# Patient Record
Sex: Female | Born: 1970 | Hispanic: Yes | Smoking: Never smoker
Health system: Southern US, Community
[De-identification: ages and names within clinical notes are randomized; demographics above are authoritative.]

## PROBLEM LIST (undated history)

## (undated) DIAGNOSIS — G43909 Migraine, unspecified, not intractable, without status migrainosus: Secondary | ICD-10-CM

## (undated) DIAGNOSIS — T8859XA Other complications of anesthesia, initial encounter: Secondary | ICD-10-CM

## (undated) DIAGNOSIS — T884XXA Failed or difficult intubation, initial encounter: Secondary | ICD-10-CM

## (undated) DIAGNOSIS — R51 Headache: Secondary | ICD-10-CM

## (undated) DIAGNOSIS — D649 Anemia, unspecified: Secondary | ICD-10-CM

## (undated) DIAGNOSIS — T4145XA Adverse effect of unspecified anesthetic, initial encounter: Secondary | ICD-10-CM

## (undated) DIAGNOSIS — K219 Gastro-esophageal reflux disease without esophagitis: Secondary | ICD-10-CM

## (undated) DIAGNOSIS — R519 Headache, unspecified: Secondary | ICD-10-CM

## (undated) HISTORY — DX: Gastro-esophageal reflux disease without esophagitis: K21.9

## (undated) HISTORY — PX: TUBAL LIGATION: SHX77

## (undated) HISTORY — DX: Migraine, unspecified, not intractable, without status migrainosus: G43.909

---

## 2011-12-13 ENCOUNTER — Ambulatory Visit: Payer: Self-pay | Admitting: Family Medicine

## 2011-12-19 ENCOUNTER — Ambulatory Visit: Payer: Self-pay | Admitting: Family Medicine

## 2012-06-25 ENCOUNTER — Ambulatory Visit: Payer: Self-pay | Admitting: Family Medicine

## 2013-02-05 ENCOUNTER — Ambulatory Visit: Payer: Self-pay | Admitting: Family Medicine

## 2014-02-09 ENCOUNTER — Ambulatory Visit: Payer: Self-pay | Admitting: Family Medicine

## 2015-03-22 DIAGNOSIS — R519 Headache, unspecified: Secondary | ICD-10-CM | POA: Insufficient documentation

## 2015-08-04 ENCOUNTER — Other Ambulatory Visit: Payer: Self-pay

## 2015-08-04 ENCOUNTER — Other Ambulatory Visit: Payer: Self-pay | Admitting: Obstetrics and Gynecology

## 2015-08-04 DIAGNOSIS — Z1231 Encounter for screening mammogram for malignant neoplasm of breast: Secondary | ICD-10-CM

## 2015-08-11 ENCOUNTER — Ambulatory Visit
Admission: RE | Admit: 2015-08-11 | Discharge: 2015-08-11 | Disposition: A | Discharge status: Home / Self Care | Payer: BLUE CROSS/BLUE SHIELD | Source: Ambulatory Visit | Attending: Obstetrics and Gynecology | Admitting: Obstetrics and Gynecology

## 2015-08-11 ENCOUNTER — Other Ambulatory Visit: Payer: Self-pay | Admitting: Neurology

## 2015-08-11 DIAGNOSIS — Z1231 Encounter for screening mammogram for malignant neoplasm of breast: Secondary | ICD-10-CM | POA: Diagnosis not present

## 2015-08-11 DIAGNOSIS — G43711 Chronic migraine without aura, intractable, with status migrainosus: Secondary | ICD-10-CM
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
## 2015-08-16 ENCOUNTER — Encounter
Admission: RE | Admit: 2015-08-16 | Discharge: 2015-08-16 | Disposition: A | Discharge status: Home / Self Care | Payer: BLUE CROSS/BLUE SHIELD | Source: Ambulatory Visit | Attending: Obstetrics and Gynecology | Admitting: Obstetrics and Gynecology

## 2015-08-16 DIAGNOSIS — Z01812 Encounter for preprocedural laboratory examination: Secondary | ICD-10-CM | POA: Diagnosis present

## 2015-08-16 DIAGNOSIS — Z01810 Encounter for preprocedural cardiovascular examination: Secondary | ICD-10-CM | POA: Diagnosis not present

## 2015-08-16 HISTORY — DX: Headache, unspecified: R51.9

## 2015-08-16 HISTORY — DX: Other complications of anesthesia, initial encounter: T88.59XA

## 2015-08-16 HISTORY — DX: Adverse effect of unspecified anesthetic, initial encounter: T41.45XA

## 2015-08-16 HISTORY — DX: Headache: R51

## 2015-08-16 HISTORY — DX: Anemia, unspecified: D64.9

## 2015-08-16 LAB — COMPREHENSIVE METABOLIC PANEL
ALBUMIN: 3.9 g/dL (ref 3.5–5.0)
ALT: 13 U/L — AB (ref 14–54)
AST: 17 U/L (ref 15–41)
Alkaline Phosphatase: 62 U/L (ref 38–126)
Anion gap: 7 (ref 5–15)
BUN: 11 mg/dL (ref 6–20)
CHLORIDE: 105 mmol/L (ref 101–111)
CO2: 24 mmol/L (ref 22–32)
CREATININE: 0.38 mg/dL — AB (ref 0.44–1.00)
Calcium: 9 mg/dL (ref 8.9–10.3)
GFR calc Af Amer: >60 mL/min (ref >60)
GFR calc non Af Amer: >60 mL/min (ref >60)
GLUCOSE: 96 mg/dL (ref 65–99)
POTASSIUM: 3.9 mmol/L (ref 3.5–5.1)
Sodium: 136 mmol/L (ref 135–145)
Total Bilirubin: 0.3 mg/dL (ref 0.3–1.2)
Total Protein: 6.8 g/dL (ref 6.5–8.1)

## 2015-08-16 LAB — CBC
HCT: 20.7 % — ABNORMAL LOW (ref 35.0–47.0)
Hemoglobin: 6.5 g/dL — ABNORMAL LOW (ref 12.0–16.0)
MCH: 23.4 pg — ABNORMAL LOW (ref 26.0–34.0)
MCHC: 31.2 g/dL — ABNORMAL LOW (ref 32.0–36.0)
MCV: 74.9 fL — AB (ref 80.0–100.0)
PLATELETS: 410 10*3/uL (ref 150–440)
RBC: 2.76 MIL/uL — AB (ref 3.80–5.20)
RDW: 21.1 % — AB (ref 11.5–14.5)
WBC: 6.3 10*3/uL (ref 3.6–11.0)

## 2015-08-16 LAB — ABO/RH: ABO/RH(D): O POS

## 2015-08-16 NOTE — Patient Instructions (Signed)
Your procedure is scheduled on: Friday 08/26/15 Su procedimiento est programado para: Report to Day Surgery. 2ND FLOOR MEDICAL MALL ENTRANCE Presntese a: To find out your arrival time please call [phone] between 1PM - 3PM on Thursday 08/25/15. Para saber su hora de llegada por favor llame al [phone] entre la 1PM - 3PM el da:  Remember: Instructions that are not followed completely may result in serious medical risk, up to and including death, or upon the discretion of your surgeon and anesthesiologist your surgery may need to be rescheduled.  Recuerde: Las instrucciones que no se siguen completamente Heritage manager en un riesgo de salud grave, incluyendo hasta la [place] o a discrecin de su cirujano y Environmental health practitioner, su ciruga se puede posponer.   __X__ 1. Do not eat food or drink liquids after midnight. No gum chewing or hard candies.  No coma alimentos ni tome lquidos despus de la medianoche.  No mastique chicle ni caramelos  duros.     __X__ 2. No alcohol for 24 hours before or after surgery.    No tome alcohol durante las 24 horas antes ni despus de la [place].   ____ 3. Bring all medications with you on the day of surgery if instructed.    Lleve todos los medicamentos con usted el da de su ciruga si se le ha indicado as.   __X__ 4. Notify your doctor if there is any change in your medical condition (cold, fever,                             infections).    Informe a su mdico si hay algn cambio en su condicin mdica (resfriado, fiebre, infecciones).   Do not wear jewelry, make-up, hairpins, clips or nail polish.  No use joyas, maquillajes, pinzas/ganchos para el cabello ni esmalte de uas.  Do not wear lotions, powders, or perfumes.   No use lociones, polvos o perfumes.      Do not shave 48 hours prior to surgery. Men may shave face and neck.  No se afeite 48 horas antes de la [place].  Los hombres pueden [employer] cara y el cuello.   Do not bring valuables  to the hospital.   No lleve objetos [facility] is not responsible for any belongings or valuables.  [place] no se hace responsable de ningn tipo de pertenencias u objetos de Geographical information systems officer.               Contacts, dentures or bridgework may not be worn into surgery.  Los lentes de [place], las dentaduras postizas o puentes no se pueden usar en la [place].  Leave your suitcase in the car. After surgery it may be brought to your room.  Deje su maleta en el auto.  Despus de la ciruga podr traerla a su habitacin.  For patients admitted to the hospital, discharge time is determined by your treatment team.  Para los pacientes que sean ingresados al hospital, el tiempo en el cual se le dar de alta es determinado por su                equipo de [place].   Patients discharged the day of surgery will not be allowed to drive home. A los pacientes que se les da de alta el mismo da de la ciruga no se les permitir conducir a Holiday representative.   Please read over the following fact sheets that  you were given: Por favor lea las siguientes hojas de informacin que le dieron:   Surgical Site Infection Prevention   ____ Take these medicines the morning of surgery with A SIP OF WATER:          [employer] medicinas la maana de la ciruga con UN SORBO DE AGUA:  1. NONE  2.   3.   4.       5.  6.  ____ Fleet Enema (as directed)          Enema de Fleet (segn lo indicado)    __X__ Use CHG Soap as directed          Utilice el jabn de CHG segn lo indicado  ____ Use inhalers on the day of surgery          Use los inhaladores el da de la ciruga  ____ Stop metformin 2 days prior to surgery          Deje de tomar el metformin 2 das antes de la ciruga    ____ Take 1/2 of usual insulin dose the night before surgery and none on the morning of surgery           Tome la mitad de la dosis habitual de insulina la noche antes de la [place] y no tome nada en la maana de la              ciruga  ____ Stop Coumadin/Plavix/aspirin on           Deje de tomar el Coumadin/Plavix/aspirina el da:  ____ Stop Anti-inflammatories on           Deje de tomar antiinflamatorios el da:   ____ Stop supplements until after surgery            Deje de tomar suplementos hasta despus de la ciruga  ____ Bring C-Pap to the hospital          [place] al hospital

## 2015-08-17 NOTE — Pre-Procedure Instructions (Signed)
VERIFIED LABS WITH HGB 6.5 RECEIVED BY DR HALFON. SPOKE WITH ANGIE

## 2015-08-17 NOTE — Pre-Procedure Instructions (Signed)
Labs sent to Dr. Newman Nip and Anesthesia for review.

## 2015-08-24 ENCOUNTER — Ambulatory Visit
Admission: RE | Admit: 2015-08-24 | Discharge: 2015-08-24 | Disposition: A | Discharge status: Home / Self Care | Payer: BLUE CROSS/BLUE SHIELD | Source: Ambulatory Visit | Attending: Obstetrics and Gynecology | Admitting: Obstetrics and Gynecology

## 2015-08-24 DIAGNOSIS — D500 Iron deficiency anemia secondary to blood loss (chronic): Secondary | ICD-10-CM | POA: Diagnosis not present

## 2015-08-24 LAB — TYPE AND SCREEN
ABO/RH(D): O POS
Antibody Screen: NEGATIVE

## 2015-08-24 LAB — PREPARE RBC (CROSSMATCH)

## 2015-08-24 MED ORDER — SODIUM CHLORIDE FLUSH 0.9 % IV SOLN
INTRAVENOUS | Status: AC
  Filled 2015-08-24: qty 20

## 2015-08-24 MED ORDER — SODIUM CHLORIDE 0.9 % IV SOLN: Freq: Once | INTRAVENOUS | Status: DC

## 2015-08-25 LAB — TYPE AND SCREEN
ABO/RH(D): O POS
ANTIBODY SCREEN: NEGATIVE
UNIT DIVISION: 0
Unit division: 0
Unit division: 0

## 2015-08-26 ENCOUNTER — Encounter
Admission: RE | Disposition: A | Discharge status: Home / Self Care | Payer: Self-pay | Source: Ambulatory Visit | Attending: Obstetrics and Gynecology

## 2015-08-26 ENCOUNTER — Ambulatory Visit: Payer: BLUE CROSS/BLUE SHIELD | Admitting: Anesthesiology

## 2015-08-26 ENCOUNTER — Observation Stay
Admission: RE | Admit: 2015-08-26 | Discharge: 2015-08-27 | Disposition: A | Discharge status: Home / Self Care | Payer: BLUE CROSS/BLUE SHIELD | Source: Ambulatory Visit | Attending: Obstetrics and Gynecology | Admitting: Obstetrics and Gynecology

## 2015-08-26 DIAGNOSIS — Z79899 Other long term (current) drug therapy: Secondary | ICD-10-CM | POA: Insufficient documentation

## 2015-08-26 DIAGNOSIS — N838 Other noninflammatory disorders of ovary, fallopian tube and broad ligament: Secondary | ICD-10-CM | POA: Insufficient documentation

## 2015-08-26 DIAGNOSIS — D259 Leiomyoma of uterus, unspecified: Secondary | ICD-10-CM | POA: Insufficient documentation

## 2015-08-26 DIAGNOSIS — G43909 Migraine, unspecified, not intractable, without status migrainosus: Secondary | ICD-10-CM | POA: Insufficient documentation

## 2015-08-26 DIAGNOSIS — R070 Pain in throat: Secondary | ICD-10-CM | POA: Diagnosis not present

## 2015-08-26 DIAGNOSIS — N711 Chronic inflammatory disease of uterus: Secondary | ICD-10-CM | POA: Diagnosis not present

## 2015-08-26 DIAGNOSIS — E559 Vitamin D deficiency, unspecified: Secondary | ICD-10-CM | POA: Diagnosis not present

## 2015-08-26 DIAGNOSIS — Z801 Family history of malignant neoplasm of trachea, bronchus and lung: Secondary | ICD-10-CM | POA: Insufficient documentation

## 2015-08-26 DIAGNOSIS — J309 Allergic rhinitis, unspecified: Secondary | ICD-10-CM | POA: Insufficient documentation

## 2015-08-26 DIAGNOSIS — N72 Inflammatory disease of cervix uteri: Secondary | ICD-10-CM | POA: Diagnosis not present

## 2015-08-26 DIAGNOSIS — Z90710 Acquired absence of both cervix and uterus: Secondary | ICD-10-CM | POA: Diagnosis present

## 2015-08-26 DIAGNOSIS — R000 Tachycardia, unspecified: Secondary | ICD-10-CM | POA: Insufficient documentation

## 2015-08-26 DIAGNOSIS — N800 Endometriosis of uterus: Secondary | ICD-10-CM | POA: Insufficient documentation

## 2015-08-26 HISTORY — PX: CYSTOSCOPY: SHX5120

## 2015-08-26 HISTORY — PX: LAPAROSCOPIC HYSTERECTOMY: SHX1926

## 2015-08-26 HISTORY — DX: Failed or difficult intubation, initial encounter: T88.4XXA

## 2015-08-26 HISTORY — PX: BILATERAL SALPINGECTOMY: SHX5743

## 2015-08-26 LAB — POCT I-STAT 4, (NA,K, GLUC, HGB,HCT)
GLUCOSE: 88 mg/dL (ref 65–99)
HEMATOCRIT: 35 % — AB (ref 36.0–46.0)
HEMOGLOBIN: 11.9 g/dL — AB (ref 12.0–15.0)
POTASSIUM: 3.8 mmol/L (ref 3.5–5.1)
SODIUM: 137 mmol/L (ref 135–145)

## 2015-08-26 LAB — POCT PREGNANCY, URINE: Preg Test, Ur: NEGATIVE

## 2015-08-26 SURGERY — HYSTERECTOMY, TOTAL, LAPAROSCOPIC
Anesthesia: General

## 2015-08-26 MED ORDER — ACETAMINOPHEN 650 MG RE SUPP
650.0000 mg | Freq: Four times a day (QID) | RECTAL | Status: DC | PRN
  Filled 2015-08-26: qty 1

## 2015-08-26 MED ORDER — SUGAMMADEX SODIUM 200 MG/2ML IV SOLN
INTRAVENOUS | Status: DC | PRN
  Administered 2015-08-26: 132.4 mg via INTRAVENOUS

## 2015-08-26 MED ORDER — KETOROLAC TROMETHAMINE 15 MG/ML IJ SOLN
15.0000 mg | Freq: Four times a day (QID) | INTRAMUSCULAR | Status: AC
  Administered 2015-08-26: 15 mg via INTRAVENOUS
  Filled 2015-08-26: qty 1

## 2015-08-26 MED ORDER — MIDAZOLAM HCL 2 MG/2ML IJ SOLN
INTRAMUSCULAR | Status: DC | PRN
  Administered 2015-08-26: 2 mg via INTRAVENOUS

## 2015-08-26 MED ORDER — CEFAZOLIN SODIUM-DEXTROSE 2-4 GM/100ML-% IV SOLN
2.0000 g | Freq: Three times a day (TID) | INTRAVENOUS | Status: DC
  Administered 2015-08-26: 2 g via INTRAVENOUS
  Administered 2015-08-26 (×2): 2000 mg via INTRAVENOUS
  Administered 2015-08-26: 2 g via INTRAVENOUS
  Filled 2015-08-26 (×3): qty 100

## 2015-08-26 MED ORDER — LIDOCAINE HCL (CARDIAC) 20 MG/ML IV SOLN
INTRAVENOUS | Status: DC | PRN
  Administered 2015-08-26: 80 mg via INTRAVENOUS

## 2015-08-26 MED ORDER — PROPOFOL 10 MG/ML IV BOLUS
INTRAVENOUS | Status: DC | PRN
  Administered 2015-08-26: 50 mg via INTRAVENOUS
  Administered 2015-08-26: 150 mg via INTRAVENOUS

## 2015-08-26 MED ORDER — ONDANSETRON HCL 4 MG/2ML IJ SOLN
4.0000 mg | Freq: Four times a day (QID) | INTRAMUSCULAR | Status: DC | PRN
  Administered 2015-08-26: 4 mg via INTRAVENOUS
  Filled 2015-08-26: qty 2

## 2015-08-26 MED ORDER — HYDROMORPHONE HCL 1 MG/ML IJ SOLN
INTRAMUSCULAR | Status: AC
  Administered 2015-08-26: 0.5 mg via INTRAVENOUS
  Filled 2015-08-26: qty 1

## 2015-08-26 MED ORDER — FENTANYL CITRATE (PF) 100 MCG/2ML IJ SOLN
INTRAMUSCULAR | Status: AC
  Administered 2015-08-26: 25 ug via INTRAVENOUS
  Filled 2015-08-26: qty 2

## 2015-08-26 MED ORDER — BUPIVACAINE HCL (PF) 0.5 % IJ SOLN
INTRAMUSCULAR | Status: AC
  Filled 2015-08-26: qty 30

## 2015-08-26 MED ORDER — FENTANYL CITRATE (PF) 100 MCG/2ML IJ SOLN
INTRAMUSCULAR | Status: DC | PRN
  Administered 2015-08-26 (×4): 50 ug via INTRAVENOUS

## 2015-08-26 MED ORDER — DEXAMETHASONE SODIUM PHOSPHATE 10 MG/ML IJ SOLN
INTRAMUSCULAR | Status: DC | PRN
  Administered 2015-08-26: 10 mg via INTRAVENOUS

## 2015-08-26 MED ORDER — LACTATED RINGERS IV SOLN
INTRAVENOUS | Status: DC
  Administered 2015-08-26 (×2): via INTRAVENOUS

## 2015-08-26 MED ORDER — CEFAZOLIN SODIUM-DEXTROSE 2-4 GM/100ML-% IV SOLN
INTRAVENOUS | Status: AC
  Administered 2015-08-26: 2 g via INTRAVENOUS
  Filled 2015-08-26: qty 100

## 2015-08-26 MED ORDER — MENTHOL 3 MG MT LOZG
1.0000 | LOZENGE | OROMUCOSAL | Status: DC | PRN
  Filled 2015-08-26: qty 9

## 2015-08-26 MED ORDER — HYDROMORPHONE HCL 1 MG/ML IJ SOLN
0.5000 mg | INTRAMUSCULAR | Status: AC | PRN
  Administered 2015-08-26 (×4): 0.5 mg via INTRAVENOUS

## 2015-08-26 MED ORDER — FENTANYL CITRATE (PF) 100 MCG/2ML IJ SOLN
25.0000 ug | INTRAMUSCULAR | Status: DC | PRN
  Administered 2015-08-26 (×4): 25 ug via INTRAVENOUS

## 2015-08-26 MED ORDER — KETOROLAC TROMETHAMINE 30 MG/ML IJ SOLN
INTRAMUSCULAR | Status: DC | PRN
  Administered 2015-08-26 (×2): 15 mg via INTRAVENOUS

## 2015-08-26 MED ORDER — BUPIVACAINE HCL (PF) 0.5 % IJ SOLN
INTRAMUSCULAR | Status: DC | PRN
  Administered 2015-08-26: 13 mL

## 2015-08-26 MED ORDER — SUCCINYLCHOLINE CHLORIDE 20 MG/ML IJ SOLN
INTRAMUSCULAR | Status: DC | PRN
  Administered 2015-08-26: 100 mg via INTRAVENOUS
  Administered 2015-08-26: 50 mg via INTRAVENOUS

## 2015-08-26 MED ORDER — PHENAZOPYRIDINE HCL 200 MG PO TABS
200.0000 mg | ORAL_TABLET | Freq: Once | ORAL | Status: AC
  Administered 2015-08-26: 200 mg via ORAL
  Filled 2015-08-26: qty 1

## 2015-08-26 MED ORDER — ACETAMINOPHEN 10 MG/ML IV SOLN
INTRAVENOUS | Status: AC
  Filled 2015-08-26: qty 100

## 2015-08-26 MED ORDER — LIDOCAINE HCL 2 % EX GEL
CUTANEOUS | Status: AC
  Filled 2015-08-26: qty 10

## 2015-08-26 MED ORDER — ONDANSETRON 4 MG PO TBDP: 4.0000 mg | ORAL_TABLET | Freq: Four times a day (QID) | ORAL | Status: DC | PRN

## 2015-08-26 MED ORDER — ACETAMINOPHEN 325 MG PO TABS: 650.0000 mg | ORAL_TABLET | Freq: Four times a day (QID) | ORAL | Status: DC | PRN

## 2015-08-26 MED ORDER — FAMOTIDINE 20 MG PO TABS
ORAL_TABLET | ORAL | Status: AC
  Administered 2015-08-26: 20 mg via ORAL
  Filled 2015-08-26: qty 1

## 2015-08-26 MED ORDER — ALBUTEROL SULFATE HFA 108 (90 BASE) MCG/ACT IN AERS
INHALATION_SPRAY | RESPIRATORY_TRACT | Status: DC | PRN
  Administered 2015-08-26: 10 via RESPIRATORY_TRACT

## 2015-08-26 MED ORDER — DIPHENHYDRAMINE HCL 12.5 MG/5ML PO ELIX
12.5000 mg | ORAL_SOLUTION | Freq: Four times a day (QID) | ORAL | Status: DC | PRN
  Filled 2015-08-26: qty 5

## 2015-08-26 MED ORDER — PHENYLEPHRINE HCL 10 MG/ML IJ SOLN
INTRAMUSCULAR | Status: DC | PRN
  Administered 2015-08-26 (×2): 100 ug via INTRAVENOUS
  Administered 2015-08-26: 200 ug via INTRAVENOUS

## 2015-08-26 MED ORDER — LACTATED RINGERS IV SOLN
INTRAVENOUS | Status: DC
  Administered 2015-08-26: 22:00:00 via INTRAVENOUS

## 2015-08-26 MED ORDER — OXYCODONE HCL 5 MG PO TABS
5.0000 mg | ORAL_TABLET | ORAL | Status: DC | PRN
  Administered 2015-08-27 (×2): 5 mg via ORAL
  Filled 2015-08-26 (×2): qty 1

## 2015-08-26 MED ORDER — KETOROLAC TROMETHAMINE 15 MG/ML IJ SOLN
15.0000 mg | Freq: Four times a day (QID) | INTRAMUSCULAR | Status: DC | PRN
  Administered 2015-08-27 (×2): 15 mg via INTRAVENOUS
  Filled 2015-08-26 (×2): qty 1

## 2015-08-26 MED ORDER — DEXMEDETOMIDINE HCL 200 MCG/2ML IV SOLN
INTRAVENOUS | Status: DC | PRN
  Administered 2015-08-26: 16 ug via INTRAVENOUS
  Administered 2015-08-26: 12 ug via INTRAVENOUS

## 2015-08-26 MED ORDER — ONDANSETRON HCL 4 MG/2ML IJ SOLN: 4.0000 mg | Freq: Once | INTRAMUSCULAR | Status: DC | PRN

## 2015-08-26 MED ORDER — HYDROMORPHONE HCL 1 MG/ML IJ SOLN
INTRAMUSCULAR | Status: DC | PRN
  Administered 2015-08-26 (×2): .6 mg via INTRAVENOUS

## 2015-08-26 MED ORDER — FAMOTIDINE 20 MG PO TABS
20.0000 mg | ORAL_TABLET | Freq: Once | ORAL | Status: AC
  Administered 2015-08-26: 20 mg via ORAL

## 2015-08-26 MED ORDER — DIPHENHYDRAMINE HCL 50 MG/ML IJ SOLN: 12.5000 mg | Freq: Four times a day (QID) | INTRAMUSCULAR | Status: DC | PRN

## 2015-08-26 MED ORDER — LIDOCAINE HCL 2 % EX GEL
CUTANEOUS | Status: DC | PRN
  Administered 2015-08-26: 1 via TOPICAL

## 2015-08-26 MED ORDER — ACETAMINOPHEN 10 MG/ML IV SOLN
INTRAVENOUS | Status: DC | PRN
  Administered 2015-08-26: 1000 mg via INTRAVENOUS

## 2015-08-26 MED ORDER — ROCURONIUM BROMIDE 100 MG/10ML IV SOLN
INTRAVENOUS | Status: DC | PRN
  Administered 2015-08-26: 10 mg via INTRAVENOUS
  Administered 2015-08-26: 30 mg via INTRAVENOUS
  Administered 2015-08-26: 10 mg via INTRAVENOUS

## 2015-08-26 MED ORDER — ONDANSETRON HCL 4 MG/2ML IJ SOLN
INTRAMUSCULAR | Status: DC | PRN
Start: 2015-08-26 — End: 2015-08-26
  Administered 2015-08-26: 4 mg via INTRAVENOUS

## 2015-08-26 SURGICAL SUPPLY — 66 items
ANCHOR TIS RET SYS 1550ML (BAG) ×5 IMPLANT
BAG URO DRAIN 2000ML W/SPOUT (MISCELLANEOUS) ×10 IMPLANT
BASIN GRAD PLASTIC 32OZ STRL (MISCELLANEOUS) ×5 IMPLANT
BLADE SURG SZ10 CARB STEEL (BLADE) ×5 IMPLANT
BLADE SURG SZ11 CARB STEEL (BLADE) ×5 IMPLANT
CANISTER SUCT 1200ML W/VALVE (MISCELLANEOUS) ×5 IMPLANT
CATH FOLEY 2WAY  5CC 16FR (CATHETERS) ×4
CATH URTH 16FR FL 2W BLN LF (CATHETERS) ×6 IMPLANT
CHLORAPREP W/TINT 26ML (MISCELLANEOUS) ×5 IMPLANT
CLOSURE WOUND 1/4X4 (GAUZE/BANDAGES/DRESSINGS) ×1
COUNTER NEEDLE 20/40 LG (NEEDLE) ×5 IMPLANT
DEFOGGER SCOPE WARMER CLEARIFY (MISCELLANEOUS) ×5 IMPLANT
DEVICE SUTURE ENDOST 10MM (ENDOMECHANICALS) IMPLANT
DRAPE STERI POUCH LG 24X46 STR (DRAPES) ×10 IMPLANT
DRSG TEGADERM 2-3/8X2-3/4 SM (GAUZE/BANDAGES/DRESSINGS) ×15 IMPLANT
GAUZE SPONGE NON-WVN 2X2 STRL (MISCELLANEOUS) ×6 IMPLANT
GLOVE BIO SURGEON STRL SZ 6 (GLOVE) ×25 IMPLANT
GLOVE INDICATOR 6.5 STRL GRN (GLOVE) ×10 IMPLANT
GOWN STRL REUS W/ TWL LRG LVL3 (GOWN DISPOSABLE) ×6 IMPLANT
GOWN STRL REUS W/ TWL XL LVL3 (GOWN DISPOSABLE) ×3 IMPLANT
GOWN STRL REUS W/TWL LRG LVL3 (GOWN DISPOSABLE) ×4
GOWN STRL REUS W/TWL MED LVL3 (GOWN DISPOSABLE) ×5 IMPLANT
GOWN STRL REUS W/TWL XL LVL3 (GOWN DISPOSABLE) ×2
HANDLE YANKAUER SUCT BULB TIP (MISCELLANEOUS) ×5 IMPLANT
IRRIGATION STRYKERFLOW (MISCELLANEOUS) ×3 IMPLANT
IRRIGATOR STRYKERFLOW (MISCELLANEOUS) ×5
IV LACTATED RINGERS 1000ML (IV SOLUTION) ×5 IMPLANT
IV NS 1000ML (IV SOLUTION) ×2
IV NS 1000ML BAXH (IV SOLUTION) ×3 IMPLANT
KIT PINK PAD W/HEAD ARE REST (MISCELLANEOUS) ×5
KIT PINK PAD W/HEAD ARM REST (MISCELLANEOUS) ×3 IMPLANT
KIT RM TURNOVER CYSTO AR (KITS) ×5 IMPLANT
LABEL OR SOLS (LABEL) IMPLANT
LIGASURE BLUNT 5MM 37CM (INSTRUMENTS) ×5 IMPLANT
LIQUID BAND (GAUZE/BANDAGES/DRESSINGS) ×5 IMPLANT
MANIPULATOR VCARE LG CRV RETR (MISCELLANEOUS) ×5 IMPLANT
MANIPULATOR VCARE SML CRV RETR (MISCELLANEOUS) IMPLANT
MANIPULATOR VCARE STD CRV RETR (MISCELLANEOUS) IMPLANT
NEEDLE VERESS 14GA 120MM (NEEDLE) ×5 IMPLANT
NS IRRIG 500ML POUR BTL (IV SOLUTION) ×5 IMPLANT
OCCLUDER COLPOPNEUMO (BALLOONS) ×5 IMPLANT
PACK GYN LAPAROSCOPIC (MISCELLANEOUS) ×5 IMPLANT
PAD OB MATERNITY 4.3X12.25 (PERSONAL CARE ITEMS) ×5 IMPLANT
PAD PREP 24X41 OB/GYN DISP (PERSONAL CARE ITEMS) ×5 IMPLANT
SCISSORS METZENBAUM CVD 33 (INSTRUMENTS) ×5 IMPLANT
SET CYSTO W/LG BORE CLAMP LF (SET/KITS/TRAYS/PACK) ×5 IMPLANT
SLEEVE ENDOPATH XCEL 5M (ENDOMECHANICALS) ×10 IMPLANT
SPONGE VERSALON 2X2 STRL (MISCELLANEOUS) ×4
SPONGE XRAY 4X4 16PLY STRL (MISCELLANEOUS) ×5 IMPLANT
STRIP CLOSURE SKIN 1/4X4 (GAUZE/BANDAGES/DRESSINGS) ×4 IMPLANT
SUT ENDO VLOC 180-0-8IN (SUTURE) IMPLANT
SUT MNCRL 4-0 (SUTURE) ×2
SUT MNCRL 4-0 27XMFL (SUTURE) ×3
SUT VIC AB 0 CT1 27 (SUTURE) ×2
SUT VIC AB 0 CT1 27XCR 8 STRN (SUTURE) ×3 IMPLANT
SUT VIC AB 0 CT1 36 (SUTURE) ×5 IMPLANT
SUT VIC AB 2-0 UR6 27 (SUTURE) ×5 IMPLANT
SUTURE MNCRL 4-0 27XMF (SUTURE) ×3 IMPLANT
SWABSTK COMLB BENZOIN TINCTURE (MISCELLANEOUS) ×5 IMPLANT
SYR 50ML LL SCALE MARK (SYRINGE) ×5 IMPLANT
SYRINGE 10CC LL (SYRINGE) ×5 IMPLANT
TROCAR ENDO BLADELESS 11MM (ENDOMECHANICALS) ×5 IMPLANT
TROCAR XCEL NON-BLD 5MMX100MML (ENDOMECHANICALS) ×5 IMPLANT
TUBING CONNECTING 10 (TUBING) ×4 IMPLANT
TUBING CONNECTING 10' (TUBING) ×1
TUBING INSUFFLATOR HEATED (MISCELLANEOUS) ×5 IMPLANT

## 2015-08-26 NOTE — Interval H&P Note (Signed)
History and Physical Interval Note:  08/26/2015 7:31 AM  Monica Obrien  has presented today for surgery, with the diagnosis of AUB  N93.8  The various methods of treatment have been discussed with the patient and family. After consideration of risks, benefits and other options for treatment, the patient has consented to  Procedure(s): HYSTERECTOMY TOTAL LAPAROSCOPIC (N/A) BILATERAL SALPINGECTOMY (Bilateral) CYSTOSCOPY as a surgical intervention .  The patient's history has been reviewed, patient examined, no change in status, stable for surgery.  I have reviewed the patient's chart and labs.  Questions were answered to the patient's satisfaction.  H/H today showed 11.1/34. Appropriate rise after 3U PRBC on Wednesday.   [place]

## 2015-08-26 NOTE — OR Nursing (Signed)
ISTAT lab results called to dr Newman Nip order received to discontinue cbc

## 2015-08-26 NOTE — Op Note (Addendum)
Monica Obrien PROCEDURE DATE: 08/26/2015  PREOPERATIVE DIAGNOSIS: AUB-L POSTOPERATIVE DIAGNOSIS: The same PROCEDURE: Total laparoscopic hysterectomy with vaginal closure, bilateral salpingectomy, cystoscopy SURGEON:  Dr. Joylene Igo ASSISTANT: Dr. Benjaman Kindler Anesthesiologist: No responsible provider has been recorded for the case. Anesthesiologist: Molli Barrows, MD CRNA: Justus Memory, CRNA; Johnna Acosta, CRNA  INDICATIONS: 45 y.o.  G3P3  here for definitive surgical management secondary to the indications listed under preoperative diagnoses; please see preoperative note for further details.  Risks of surgery were discussed with the patient including but not limited to: bleeding which may require transfusion or reoperation; infection which may require antibiotics; injury to bowel, bladder, ureters or other surrounding organs; need for additional procedures; thromboembolic phenomenon, incisional problems and other postoperative/anesthesia complications. Written informed consent was obtained.    FINDINGS:   14 week multi-fibroid uterus, 4cm posterior fibroid Normal ovaries, right ovary with 1cm cyst Normal bladder  ANESTHESIA:    General INTRAVENOUS FLUIDS:2500  ml ESTIMATED BLOOD LOSS:300 ml URINE OUTPUT: 500 ml   SPECIMENS: Morcellated Uterus, cervix, bilateral fallopian tubes COMPLICATIONS: None immediate  PROCEDURE IN DETAIL:  The patient received prophylactic ancef 2g intravenous antibiotics, pyridium 200mg  PO, and had sequential compression devices applied to her lower extremities while in the preoperative area.  She was then taken to the operating room where general anesthesia was administered and was found to be adequate.  She was placed in the dorsal lithotomy position, and was prepped and draped in a sterile manner.  A formal time out was performed with all team members present and in agreement.  A V-care uterine manipulator was placed at this time.  A  Foley catheter was inserted into her bladder and attached to constant drainage. Attention was turned to the abdomen where a 36mm incision was made in the superior base of the umbilical plate with an [id number] blade. Under direct visualization, the 5-mm optiview trochar was introduced into the abdomen. Opening pressure <5. The abdomen was then insufflated with carbon dioxide gas and adequate pneumoperitoneum was obtained.  A survey of the patient's pelvis and abdomen revealed the findings above.  Bilateral lower quadrant ports (5 mm on the right and 5 mm on the left) were then placed under direct visualization.  The bilateral fallopian tubes were dissected off of the ovary bilaterally and removed through the port.The bilateral round and broad ligaments were then clamped and transected with the Ligasure device.  The uterine artery was then skeletonized and a bladder flap was created.  The ureters were noted to be safely away from the area of dissection.  The bladder was dissected off the lower uterine segment.  The left uterine arteries were ligated using the LIgasure but not cut. Attention was then turned to the right uterine vessels which were clamped, ligated and cut. A releasing cut was also done. Attention then turned back to the left uterine vessels, which were ligated and now cut. A releasing cut was also done.    Attention was then turned to the cervicovaginal junction, and the monopolar scissors were used on both cut and coag to transect the cervix from the surrounding vagina using the ring of the V-care as a guide. This was done circumferentially until the posterior aspect of the cervicovaginal junction where the 4cm fibroid was obstructing the visualization and preventing safe transection. The decision was made to finish from below, and heaney clamps and curved scissors were used to transect the posterior cervix from the vaginal wall. The uterus was too large to deliver  through the vagina. A 15-mm endoscopic  bag was introduced through the vagina and the specimen was placed into the bag and pulled out of the vagina to allow morcellation. Hand morcellation was done with a long 10 blade knife with difficulty as the tissue easily pulled off with traction.  The uterus was successfully removed through the vagina. The vaginal cuff was closed using 0-vicryl in a running fashion attempting to incorporate the uterosacral ligaments. Hemostasis was noted.    Cystoscopy showed bilateral ureteral jets.  No stitches were visualized in the bladder during cystoscopy.   The abdomen was then re-insufflated. Excellent hemostasis was noted.   All trocars were removed after the abdomen was desufflated and the patient was given three deep breaths.  All skin incisions were closed with 4-0 Vicryl subcuticular stitches. The patient tolerated the procedures well.  All instruments, needles, and sponge counts were correct x 2. The patient was taken to the recovery room awake, extubated and in stable condition.   Lorette Ang, MD

## 2015-08-26 NOTE — Anesthesia Preprocedure Evaluation (Addendum)
Anesthesia Evaluation  Patient identified by MRN, date of birth, ID band Patient awake    Reviewed: Allergy & Precautions, H&P , NPO status , Patient's Chart, lab work & pertinent test results, reviewed documented beta blocker date and time   History of Anesthesia Complications (+) PONV and history of anesthetic complications  Airway Mallampati: II   Neck ROM: full    Dental  (+) Poor Dentition   Pulmonary neg pulmonary ROS,    Pulmonary exam normal        Cardiovascular negative cardio ROS Normal cardiovascular exam Rhythm:regular Rate:Normal     Neuro/Psych  Headaches, negative neurological ROS  negative psych ROS   GI/Hepatic negative GI ROS, Neg liver ROS,   Endo/Other  negative endocrine ROS  Renal/GU negative Renal ROS  negative genitourinary   Musculoskeletal   Abdominal   Peds  Hematology negative hematology ROS (+) anemia ,   Anesthesia Other Findings Past Medical History:   Anemia                                                       Headache                                                     Complication of anesthesia                                     Comment:elevated blood pressure Past Surgical History:   TUBAL LIGATION                                              BMI    Body Mass Index   28.51 kg/m 2     Reproductive/Obstetrics                             Anesthesia Physical Anesthesia Plan  ASA: I  Anesthesia Plan: General and General ETT   Post-op Pain Management:    Induction: Intravenous  Airway Management Planned: Oral ETT  Additional Equipment:   Intra-op Plan:   Post-operative Plan:   Informed Consent: I have reviewed the patients History and Physical, chart, labs and discussed the procedure including the risks, benefits and alternatives for the proposed anesthesia with the patient or authorized representative who has indicated his/her  understanding and acceptance.   Dental Advisory Given  Plan Discussed with: CRNA  Anesthesia Plan Comments:        Anesthesia Quick Evaluation

## 2015-08-26 NOTE — Anesthesia Procedure Notes (Signed)
Procedure Name: Intubation Date/Time: 08/26/2015 8:02 AM Performed by: Justus Memory Pre-anesthesia Checklist: Patient identified, Emergency Drugs available, Suction available and Patient being monitored Patient Re-evaluated:Patient Re-evaluated prior to inductionOxygen Delivery Method: Circle system utilized Preoxygenation: Pre-oxygenation with 100% oxygen Intubation Type: IV induction Laryngoscope Size: Glidescope and 3 Grade View: Grade II Tube type: Oral Tube size: 7.0 mm Number of attempts: 2 Airway Equipment and Method: Patient positioned with wedge pillow,  Rigid stylet and Bougie stylet Placement Confirmation: ETT inserted through vocal cords under direct vision,  positive ETCO2 and breath sounds checked- equal and bilateral Secured at: 21 cm Tube secured with: Tape Dental Injury: Bloody posterior oropharynx  Difficulty Due To: Difficulty was anticipated, Difficult Airway- due to limited oral opening and Difficult Airway- due to anterior larynx Future Recommendations: Recommend- induction with short-acting agent, and alternative techniques readily available Comments: TD < 3cm, glidescope  LoPro S3 used, grade 1 view obtained, however larynx was to anterior to insert ETT, attempted /c bougie, still unable to make the angle, Dr. Andree Elk placed an extreme bend on stylet (U-shaped) and with difficulty passed ETT through cords

## 2015-08-26 NOTE — Transfer of Care (Signed)
Immediate Anesthesia Transfer of Care Note  Patient: Monica Obrien  Procedure(s) Performed: Procedure(s): HYSTERECTOMY TOTAL LAPAROSCOPIC (N/A) BILATERAL SALPINGECTOMY (Bilateral) CYSTOSCOPY  Patient Location: PACU  Anesthesia Type:General  Level of Consciousness: sedated  Airway & Oxygen Therapy: Patient Spontanous Breathing and Patient connected to face mask oxygen  Post-op Assessment: Report given to RN and Post -op Vital signs reviewed and stable  Post vital signs: Reviewed and stable  Last Vitals:  Filed Vitals:   08/26/15 0742 08/26/15 1318  BP:  123/68  Pulse:  112  Temp: 37.5 C 37.6 C  Resp:      Last Pain: There were no vitals filed for this visit.       Complications: No apparent anesthesia complications

## 2015-08-27 DIAGNOSIS — N72 Inflammatory disease of cervix uteri: Secondary | ICD-10-CM | POA: Diagnosis not present

## 2015-08-27 LAB — CBC
HEMATOCRIT: 26.6 % — AB (ref 35.0–47.0)
Hemoglobin: 8.5 g/dL — ABNORMAL LOW (ref 12.0–16.0)
MCH: 25 pg — AB (ref 26.0–34.0)
MCHC: 31.8 g/dL — ABNORMAL LOW (ref 32.0–36.0)
MCV: 78.4 fL — AB (ref 80.0–100.0)
Platelets: 284 10*3/uL (ref 150–440)
RBC: 3.39 MIL/uL — AB (ref 3.80–5.20)
RDW: 23 % — ABNORMAL HIGH (ref 11.5–14.5)
WBC: 12.6 10*3/uL — ABNORMAL HIGH (ref 3.6–11.0)

## 2015-08-27 LAB — BASIC METABOLIC PANEL
Anion gap: 6 (ref 5–15)
BUN: 6 mg/dL (ref 6–20)
CALCIUM: 8.5 mg/dL — AB (ref 8.9–10.3)
CO2: 25 mmol/L (ref 22–32)
CREATININE: 0.44 mg/dL (ref 0.44–1.00)
Chloride: 106 mmol/L (ref 101–111)
GFR calc non Af Amer: >60 mL/min (ref >60)
Glucose, Bld: 98 mg/dL (ref 65–99)
Potassium: 3.7 mmol/L (ref 3.5–5.1)
SODIUM: 137 mmol/L (ref 135–145)

## 2015-08-27 MED ORDER — DOCUSATE SODIUM 100 MG PO CAPS: 100.0000 mg | ORAL_CAPSULE | Freq: Two times a day (BID) | ORAL | Status: DC

## 2015-08-27 MED ORDER — OXYCODONE HCL 5 MG PO TABS: 5.0000 mg | ORAL_TABLET | ORAL | Status: DC | PRN

## 2015-08-27 NOTE — Progress Notes (Signed)
MD NOTE  POD#1 Surgery: Lapx TLH/BS/cystoscopy   S: pt doing well. Voiding, OOB, ambulating, pain controlled with PO pain meds, eating, +flatus, some left arm numbness. Some pain in throat.  No other complaints.   O: Filed Vitals:   08/27/15 0429 08/27/15 0827  BP: 105/53 118/69  Pulse: 87 77  Temp: 98.7 F (37.1 C) 98.7 F (37.1 C)  Resp: 18 20   General appearance: alert, cooperative and no distress Head: Normocephalic, without obvious abnormality, atraumatic Lungs: clear to auscultation bilaterally Heart: regular rate and rhythm, S1, S2 normal, no murmur, click, rub or gallop Abdomen: soft, mildly distended, no rebound/guarding, incisions healing well, dressing removed Extremities: extremities normal, atraumatic, no cyanosis or edema   A/P: 45yo G3P3 POD#1 s/p Lapx TLH/Bs/cystoscopy for AUB-L. Doing well.  - anticipate D/c this afternoon  Lorette Ang, MD

## 2015-08-27 NOTE — Progress Notes (Signed)
All discharge instructions given to patient and she voices understanding of all instructions given. She will make her appt for 2 wks and 6 wks out. Prescription given.  Patient discharged  home with daughter escorted out by RN in wheelchair

## 2015-08-27 NOTE — Discharge Summary (Signed)
Physician Discharge Summary  Patient ID: Monica Obrien MRN: [id number] DOB/AGE: Mar 03, 1971 45 y.o.  Admit date: 08/26/2015 Discharge date: 08/27/2015  Admission Diagnoses:  Discharge Diagnoses:  Active Problems:   S/P laparoscopic hysterectomy   Discharged Condition: good  Hospital Course: Admitted for Lapx TLH/BS/cystoscopy. Uncomplicated post-operative course. Ambulating, voiding, passing flatus, stable vital signs.    Discharge Exam: Blood pressure 118/69, pulse 77, temperature 98.7 F (37.1 C), temperature source Oral, resp. rate 20, weight 66.225 kg (146 lb), last menstrual period 06/08/2015, SpO2 100 %. General appearance: alert, cooperative and no distress Resp: clear to auscultation bilaterally Cardio: regular rate and rhythm, S1, S2 normal, no murmur, click, rub or gallop GI: soft; bowel sounds normal; no masses,  no organomegaly and appropriately tender Extremities: extremities normal, atraumatic, no cyanosis or edema Skin: Skin color, texture, turgor normal. No rashes or lesions or incisions healing well  Disposition: Final discharge disposition not confirmed     Medication List    STOP taking these medications        ferrous sulfate 325 (65 FE) MG tablet      TAKE these medications        butalbital-acetaminophen-caffeine 50-325-40 MG tablet  Commonly known as:  FIORICET, ESGIC  Take 1 tablet by mouth 2 (two) times daily as needed for headache (one tablet at onset of headache., may take second dose after 4 hours if needed, no more than 2 tablets per day). Reported on 08/26/2015     docusate sodium 100 MG capsule  Commonly known as:  COLACE  Take 1 capsule (100 mg total) by mouth 2 (two) times daily.     nortriptyline 10 MG capsule  Commonly known as:  PAMELOR  Take 20 mg by mouth at bedtime.     oxyCODONE 5 MG immediate release tablet  Commonly known as:  Oxy IR/ROXICODONE  Take 1 tablet (5 mg total) by mouth every 4 (four) hours as needed  for moderate pain or severe pain.      ASK your doctor about these medications        Vitamin D (Cholecalciferol) 1000 units Caps  Take 2 capsules by mouth daily.           Follow-up Information    Follow up with Lorette Ang, MD In 2 weeks.   Specialty:  Obstetrics and Gynecology   Contact information:   [place] [address] [phone]       Follow up with Lorette Ang, MD In 6 weeks.   Specialty:  Obstetrics and Gynecology   Contact information:   [address] [place] [address] [phone]       Signed: Lorette Ang 08/27/2015, 12:58 PM

## 2015-08-27 NOTE — Discharge Instructions (Signed)
Histerectoma total laparoscpica, cuidados posteriores (Total Laparoscopic Hysterectomy, Care After) Siga estas instrucciones durante las prximas semanas. Estas indicaciones le proporcionan informacin acerca de cmo deber cuidarse despus del procedimiento. El mdico tambin podr darle instrucciones ms especficas. El tratamiento se ha planificado de acuerdo a las prcticas mdicas actuales, pero a veces se producen problemas. Comunquese con el mdico si tiene algn problema o tiene dudas despus del procedimiento. QU ESPERAR DESPUS DEL PROCEDIMIENTO  Dolor y hematomas en los sitios de incisin. Le darn analgsicos para Financial controller.  Podr tener sntomas de menopausia, como calor repentino, sudoraciones nocturnas e insomnio si le han extirpado los ovarios.  Podr sentir dolor de garganta por el tubo del respirador que le colocaron durante la [place]. INSTRUCCIONES PARA EL CUIDADO EN EL HOGAR  Utilice los medicamentos de venta libre o recetados para Glass blower/designer, el malestar o la fiebre, segn se lo indique el mdico.  No tome aspirina. Puede ocasionar hemorragias.  No conduzca mientras est tomando analgsicos.  Siga las indicaciones de su mdico con respecto a la dieta, la actividad fsica, levantar objetos, conducir y para las actividades en general.  Reanude su dieta habitual, segn las indicaciones y los permisos.  Descanse y duerma lo suficiente.  Nose haga duchas vaginales, no utilice tampones ni tenga relaciones sexuales durante al menos 6 semanas o hasta que el profesional la autorice.  Cambie los apsitos (vendajes) tal como le indic su mdico.  Controle su temperatura y notifique a su mdico si tiene fiebre.  Tome duchas en lugar de baos durante 2 a 3 semanas.  No beba alcohol hasta que el mdico la autorice.  Si est estreida, tome un laxante suave, si el mdico la [place]. Los alimentos que contienen salvado la ayudarn para el problema de la  estreimiento. Debe ingerir la cantidad de lquidos suficientes para Theatre manager la orina de tono claro o color amarillo plido.  Trate de que alguien la acompae en su casa durante 1 o 2semanas, para ayudarla con los [employer].  Cumpla con todas las visitas de control, segn le indique su mdico. SOLICITE ATENCIN MDICA SI:  Observa enrojecimiento, hinchazn o siente dolor cada vez ms intenso en los sitios de las incisiones.  Tiene pus en el sitio de la incisin.  Advierte un olor feo que proviene de la incisin.  La incisin se abre.  Se siente mareada o sufre un desmayo.  Siente dolor o tiene una hemorragia al [employer].  Tiene diarrea persistente.  Tiene nuseas o vmitos persistentes.  Tiene flujo vaginal anormal.  Tiene una erupcin cutnea.  Tiene alguna reaccin anormal o aparece una alergia por los medicamentos.  El dolor no se alivia con los medicamentos recetados. SOLICITE ATENCIN MDICA DE INMEDIATO SI:  Siente falta de aire o dolor en el pecho.  Siente dolor intenso abdominal que no se alivia con los [employer].  Presenta dolor o hinchazn en las piernas. ASEGRESE DE QUE:  Comprende estas instrucciones.  Controlar su afeccin.  Recibir ayuda de inmediato si no mejora o si empeora.   Esta informacin no tiene Marine scientist el consejo del mdico. Asegrese de hacerle al mdico cualquier pregunta que tenga.   Document Released: 01/14/2013 Document Revised: 03/31/2013 Elsevier Interactive Patient Education [employer]. Follow up sooner fever greater than 100.5, problems breathing or pain not helped by medication, severe bleeding (saturating more than one pad an hour or large palm sized clots), or severe depression No driving while taking narcotics, No heavy lifting x  6 wks. Follow up sooner if you have any incisional concerns such as increased redness, swelling, discharge or increase in pain at incision site. No douches,  intercourse , tampons, or enemas for 6 weeks.

## 2015-08-29 ENCOUNTER — Encounter: Payer: Self-pay | Admitting: Obstetrics and Gynecology

## 2015-08-29 LAB — SURGICAL PATHOLOGY

## 2015-08-29 NOTE — Anesthesia Postprocedure Evaluation (Signed)
Anesthesia Post Note  Patient: Monica Obrien  Procedure(s) Performed: Procedure(s) (LRB): HYSTERECTOMY TOTAL LAPAROSCOPIC (N/A) BILATERAL SALPINGECTOMY (Bilateral) CYSTOSCOPY  Patient location during evaluation: PACU Anesthesia Type: General Level of consciousness: awake and alert Pain management: pain level controlled Vital Signs Assessment: post-procedure vital signs reviewed and stable Respiratory status: spontaneous breathing, nonlabored ventilation, respiratory function stable and patient connected to nasal cannula oxygen Cardiovascular status: blood pressure returned to baseline and stable Postop Assessment: no signs of nausea or vomiting Anesthetic complications: no    Last Vitals:  Filed Vitals:   08/27/15 0827 08/27/15 1334  BP: 118/69 105/58  Pulse: 77 83  Temp: 37.1 C 36.5 C  Resp: 20 18    Last Pain:  Filed Vitals:   08/27/15 1335  PainSc: Merrick Adams

## 2015-08-31 ENCOUNTER — Ambulatory Visit
Admission: RE | Admit: 2015-08-31 | Discharge: 2015-08-31 | Disposition: A | Discharge status: Home / Self Care | Payer: BLUE CROSS/BLUE SHIELD | Source: Ambulatory Visit | Attending: Neurology | Admitting: Neurology

## 2015-08-31 DIAGNOSIS — G43711 Chronic migraine without aura, intractable, with status migrainosus: Secondary | ICD-10-CM | POA: Diagnosis present

## 2015-09-01 NOTE — Addendum Note (Signed)
Addendum  created 09/01/15 1521 by Johnna Acosta, CRNA   Modules edited: Anesthesia Responsible Staff

## 2015-09-20 DIAGNOSIS — G43009 Migraine without aura, not intractable, without status migrainosus: Secondary | ICD-10-CM | POA: Insufficient documentation

## 2015-09-20 DIAGNOSIS — M791 Myalgia, unspecified site: Secondary | ICD-10-CM | POA: Insufficient documentation

## 2016-08-01 DIAGNOSIS — R200 Anesthesia of skin: Secondary | ICD-10-CM | POA: Insufficient documentation

## 2016-08-01 DIAGNOSIS — G43909 Migraine, unspecified, not intractable, without status migrainosus: Secondary | ICD-10-CM | POA: Insufficient documentation

## 2016-08-02 ENCOUNTER — Other Ambulatory Visit: Payer: Self-pay | Admitting: Obstetrics and Gynecology

## 2016-08-02 DIAGNOSIS — Z1231 Encounter for screening mammogram for malignant neoplasm of breast: Secondary | ICD-10-CM

## 2016-08-30 ENCOUNTER — Ambulatory Visit
Admission: RE | Admit: 2016-08-30 | Discharge: 2016-08-30 | Disposition: A | Discharge status: Home / Self Care | Payer: BLUE CROSS/BLUE SHIELD | Source: Ambulatory Visit | Attending: Obstetrics and Gynecology | Admitting: Obstetrics and Gynecology

## 2016-08-30 DIAGNOSIS — Z1231 Encounter for screening mammogram for malignant neoplasm of breast: Secondary | ICD-10-CM | POA: Diagnosis present

## 2017-01-06 ENCOUNTER — Encounter: Payer: Self-pay | Admitting: Emergency Medicine

## 2017-01-06 ENCOUNTER — Emergency Department: Payer: BLUE CROSS/BLUE SHIELD

## 2017-01-06 ENCOUNTER — Emergency Department
Admission: EM | Admit: 2017-01-06 | Discharge: 2017-01-06 | Disposition: A | Discharge status: Home / Self Care | Payer: BLUE CROSS/BLUE SHIELD | Attending: Emergency Medicine | Admitting: Emergency Medicine

## 2017-01-06 DIAGNOSIS — N390 Urinary tract infection, site not specified: Secondary | ICD-10-CM | POA: Insufficient documentation

## 2017-01-06 DIAGNOSIS — R1084 Generalized abdominal pain: Secondary | ICD-10-CM | POA: Diagnosis not present

## 2017-01-06 LAB — URINALYSIS, COMPLETE (UACMP) WITH MICROSCOPIC
Bacteria, UA: NONE SEEN
Bilirubin Urine: NEGATIVE
GLUCOSE, UA: NEGATIVE mg/dL
Hgb urine dipstick: NEGATIVE
Ketones, ur: NEGATIVE mg/dL
Leukocytes, UA: NEGATIVE
Nitrite: POSITIVE — AB
PROTEIN: NEGATIVE mg/dL
Specific Gravity, Urine: 1.015 (ref 1.005–1.030)
pH: 6 (ref 5.0–8.0)

## 2017-01-06 LAB — BASIC METABOLIC PANEL
ANION GAP: 8 (ref 5–15)
BUN: 12 mg/dL (ref 6–20)
CALCIUM: 9 mg/dL (ref 8.9–10.3)
CO2: 24 mmol/L (ref 22–32)
CREATININE: 0.57 mg/dL (ref 0.44–1.00)
Chloride: 104 mmol/L (ref 101–111)
GFR calc Af Amer: >60 mL/min (ref >60)
GFR calc non Af Amer: >60 mL/min (ref >60)
GLUCOSE: 102 mg/dL — AB (ref 65–99)
Potassium: 3.7 mmol/L (ref 3.5–5.1)
Sodium: 136 mmol/L (ref 135–145)

## 2017-01-06 LAB — CBC
HCT: 37.6 % (ref 35.0–47.0)
HEMOGLOBIN: 13.2 g/dL (ref 12.0–16.0)
MCH: 30.1 pg (ref 26.0–34.0)
MCHC: 35 g/dL (ref 32.0–36.0)
MCV: 86 fL (ref 80.0–100.0)
Platelets: 321 10*3/uL (ref 150–440)
RBC: 4.37 MIL/uL (ref 3.80–5.20)
RDW: 13.7 % (ref 11.5–14.5)
WBC: 6.8 10*3/uL (ref 3.6–11.0)

## 2017-01-06 MED ORDER — CEPHALEXIN 500 MG PO CAPS
500.0000 mg | ORAL_CAPSULE | Freq: Once | ORAL | Status: AC
  Administered 2017-01-06: 500 mg via ORAL

## 2017-01-06 MED ORDER — CEPHALEXIN 500 MG PO CAPS
ORAL_CAPSULE | ORAL | Status: AC
  Filled 2017-01-06: qty 1

## 2017-01-06 MED ORDER — CEPHALEXIN 500 MG PO CAPS: 500.0000 mg | ORAL_CAPSULE | Freq: Three times a day (TID) | ORAL | 0 refills | Status: DC

## 2017-01-06 NOTE — ED Triage Notes (Signed)
Pt to ED via POV c/o Left lower back pain, fatigue, and dizziness x 4 days. Pt states that the pain is a burning type pain. Dizziness comes and go and she feels tired all the time. Pt does not appear to be in any distress at this time.

## 2017-01-06 NOTE — ED Notes (Signed)
Pt transported to CT ?

## 2017-01-06 NOTE — ED Notes (Signed)
The EKG was completed and signed by Dr. Burlene Arnt.

## 2017-01-06 NOTE — ED Notes (Signed)
Pt alert and oriented X4, active, cooperative, pt in NAD. RR even and unlabored, color WNL.  Pt informed to return if any life threatening symptoms occur.  Discharge and followup instructions reviewed.  

## 2017-01-06 NOTE — ED Provider Notes (Addendum)
[facility] Emergency Department Provider Note  Time seen: 10:27 AM  I have reviewed the triage vital signs and the nursing notes.   HISTORY  Chief Complaint Back Pain and Fatigue    HPI Monica Obrien is a 46 y.o. female With a past medical history of anemia, headache, presents to the emergency department for left flank pain. According to the patient for the past 4 days she has been increasing pain in her left back. States also mild lower abdominal pain. Patient states she thought it could've been a urinary tract infection show she has been taking over-the-counter Pyridium, but states it has not helped. Denies any fever. Denies any vomiting. Denies any diarrhea. Denies any dysuria. Does state dark urine but is taking Pyridium.   Past Medical History:  Diagnosis Date  . Anemia   . Complication of anesthesia    elevated blood pressure  . Difficult intubation   . Headache     Patient Active Problem List   Diagnosis Date Noted  . S/P laparoscopic hysterectomy 08/26/2015    Past Surgical History:  Procedure Laterality Date  . BILATERAL SALPINGECTOMY Bilateral 08/26/2015   Procedure: BILATERAL SALPINGECTOMY;  Surgeon: Lorette Ang, MD;  Location: [facility] ORS;  Service: Gynecology;  Laterality: Bilateral;  . CYSTOSCOPY  08/26/2015   Procedure: CYSTOSCOPY;  Surgeon: Lorette Ang, MD;  Location: [facility] ORS;  Service: Gynecology;;  . LAPAROSCOPIC HYSTERECTOMY N/A 08/26/2015   Procedure: HYSTERECTOMY TOTAL LAPAROSCOPIC;  Surgeon: Lorette Ang, MD;  Location: [facility] ORS;  Service: Gynecology;  Laterality: N/A;  . TUBAL LIGATION      Prior to Admission medications   Medication Sig Start Date End Date Taking? Authorizing Provider  butalbital-acetaminophen-caffeine (FIORICET, ESGIC) 50-325-40 MG tablet Take 1 tablet by mouth 2 (two) times daily as needed for headache (one tablet at onset of headache., may take second dose after 4 hours if needed, no more  than 2 tablets per day). Reported on 08/26/2015    [provider]  docusate sodium (COLACE) 100 MG capsule Take 1 capsule (100 mg total) by mouth 2 (two) times daily. 08/27/15   Halfon, Guerry Bruin, MD  nortriptyline (PAMELOR) 10 MG capsule Take 20 mg by mouth at bedtime.    [provider]  oxyCODONE (OXY IR/ROXICODONE) 5 MG immediate release tablet Take 1 tablet (5 mg total) by mouth every 4 (four) hours as needed for moderate pain or severe pain. 08/27/15   Halfon, Guerry Bruin, MD    Allergies  Allergen Reactions  . Imitrex [Sumatriptan] Swelling    No family history on file.  Social History Social History  Substance Use Topics  . Smoking status: Never Smoker  . Smokeless tobacco: Never Used  . Alcohol use No    Review of Systems Constitutional: Negative for fever. Cardiovascular: Negative for chest pain. Respiratory: Negative for shortness of breath. Gastrointestinal: left flank pain. Negative for vomiting or diarrhea. Genitourinary: Negative for dysuria.positive for dark urine. Musculoskeletal: left back pain. Neurological: Negative for headache All other ROS negative  ____________________________________________   PHYSICAL EXAM:  VITAL SIGNS: ED Triage Vitals  Enc Vitals Group     BP 01/06/17 0956 133/80     Pulse Rate 01/06/17 0956 65     Resp 01/06/17 0956 16     Temp 01/06/17 0956 98.3 F (36.8 C)     Temp Source 01/06/17 0956 Oral     SpO2 01/06/17 0956 98 %     Weight --  Height --      Head Circumference --      Peak Flow --      Pain Score 01/06/17 0953 7     Pain Loc --      Pain Edu? --      Excl. in [employer]? --     Constitutional: Alert and oriented. Well appearing and in no distress. Eyes: Normal exam ENT   Head: Normocephalic and atraumatic.   Mouth/Throat: Mucous membranes are moist. Cardiovascular: Normal rate, regular rhythm. No murmur Respiratory: Normal respiratory effort without tachypnea nor retractions. Breath  sounds are clear  Gastrointestinal: soft, mild suprapubic tenderness to palpation. No rebound or guarding. No distention. No CVA tenderness. Musculoskeletal: patient does have mild tenderness over the left SI joint. No midline tenderness. No CVA tenderness. Neurologic:  No gross focal neurologic deficits  Skin:  Skin is warm, dry and intact.  Psychiatric: Mood and affect are normal.  ____________________________________________   RADIOLOGY  IMPRESSION: 1. No acute abnormality. 2. 4 mm right lower lobe subpleural nodule. No follow-up needed if patient is low-risk. Non-contrast chest CT can be considered in 12 months if patient is high-risk. This recommendation follows the consensus statement: Guidelines for Management of Incidental Pulmonary Nodules Detected on CT Images: From the Fleischner Society 2017; Radiology 2017; 284:228-243.   EKG reviewed and interpreted by myself shows normal sinus rhythm at 65 bpm, narrow QRS, normal axis, normal intervals, no concerning ST changes.  ____________________________________________   INITIAL IMPRESSION / ASSESSMENT AND PLAN / ED COURSE  Pertinent labs & imaging results that were available during my care of the patient were reviewed by me and considered in my medical decision making (see chart for details).  patient presents to the emergency department with left back pain, and mild suprapubic pain. Patient concerned of her urinary tract infection or kidney stone. Overall the patient appears well, no distress with normal vitals. Differential this time would include ureterolithiasis, urinary tract infection, pyelonephritis, colitis/diverticulitis. We will check labs, obtain a CT renal scan to rule out ureterolithiasis. We will check a urinalysis as well.  CT shows no acute abnormality. Patient does not smoke nor has ever smoked cigarettes, low risk no follow-up needed.  labs show nitrite positive urine we will treat with Keflex by mouth patient  will follow up with her primary care doctor.  ____________________________________________   FINAL CLINICAL IMPRESSION(S) / ED DIAGNOSES  left flank pain urinary tract infection   Harvest Dark, MD 01/06/17 1221    Harvest Dark, MD 01/06/17 1225

## 2017-01-08 LAB — URINE CULTURE: CULTURE: NO GROWTH

## 2018-01-15 ENCOUNTER — Other Ambulatory Visit: Payer: Self-pay | Admitting: Obstetrics and Gynecology

## 2018-01-15 DIAGNOSIS — Z1231 Encounter for screening mammogram for malignant neoplasm of breast: Secondary | ICD-10-CM

## 2018-02-11 ENCOUNTER — Ambulatory Visit
Admission: RE | Admit: 2018-02-11 | Discharge: 2018-02-11 | Disposition: A | Discharge status: Home / Self Care | Payer: BLUE CROSS/BLUE SHIELD | Source: Ambulatory Visit | Attending: Obstetrics and Gynecology | Admitting: Obstetrics and Gynecology

## 2018-02-11 DIAGNOSIS — Z1231 Encounter for screening mammogram for malignant neoplasm of breast: Secondary | ICD-10-CM | POA: Diagnosis not present

## 2018-04-17 DIAGNOSIS — A90 Dengue fever [classical dengue]: Secondary | ICD-10-CM | POA: Diagnosis present

## 2018-04-17 DIAGNOSIS — Z8262 Family history of osteoporosis: Secondary | ICD-10-CM

## 2018-04-17 DIAGNOSIS — R5381 Other malaise: Secondary | ICD-10-CM | POA: Diagnosis present

## 2018-04-17 DIAGNOSIS — K819 Cholecystitis, unspecified: Secondary | ICD-10-CM | POA: Diagnosis present

## 2018-04-17 DIAGNOSIS — R5383 Other fatigue: Secondary | ICD-10-CM | POA: Diagnosis present

## 2018-04-17 DIAGNOSIS — D6959 Other secondary thrombocytopenia: Principal | ICD-10-CM | POA: Diagnosis present

## 2018-04-17 DIAGNOSIS — Z90710 Acquired absence of both cervix and uterus: Secondary | ICD-10-CM

## 2018-04-17 DIAGNOSIS — Z888 Allergy status to other drugs, medicaments and biological substances status: Secondary | ICD-10-CM

## 2018-04-17 DIAGNOSIS — D509 Iron deficiency anemia, unspecified: Secondary | ICD-10-CM | POA: Diagnosis present

## 2018-04-17 DIAGNOSIS — D72819 Decreased white blood cell count, unspecified: Secondary | ICD-10-CM | POA: Diagnosis present

## 2018-04-17 DIAGNOSIS — R740 Nonspecific elevation of levels of transaminase and lactic acid dehydrogenase [LDH]: Secondary | ICD-10-CM | POA: Diagnosis present

## 2018-04-17 DIAGNOSIS — M549 Dorsalgia, unspecified: Secondary | ICD-10-CM | POA: Diagnosis present

## 2018-04-17 DIAGNOSIS — R040 Epistaxis: Secondary | ICD-10-CM | POA: Diagnosis present

## 2018-04-17 DIAGNOSIS — D696 Thrombocytopenia, unspecified: Secondary | ICD-10-CM | POA: Diagnosis not present

## 2018-04-18 ENCOUNTER — Emergency Department: Payer: BLUE CROSS/BLUE SHIELD

## 2018-04-18 ENCOUNTER — Inpatient Hospital Stay: Payer: BLUE CROSS/BLUE SHIELD

## 2018-04-18 ENCOUNTER — Other Ambulatory Visit: Payer: Self-pay

## 2018-04-18 ENCOUNTER — Inpatient Hospital Stay
Admission: EM | Admit: 2018-04-18 | Discharge: 2018-04-20 | DRG: 813 | Disposition: A | Discharge status: Home / Self Care | Payer: BLUE CROSS/BLUE SHIELD | Attending: Internal Medicine | Admitting: Internal Medicine

## 2018-04-18 ENCOUNTER — Encounter: Payer: Self-pay | Admitting: Emergency Medicine

## 2018-04-18 DIAGNOSIS — D696 Thrombocytopenia, unspecified: Secondary | ICD-10-CM | POA: Diagnosis present

## 2018-04-18 DIAGNOSIS — K819 Cholecystitis, unspecified: Secondary | ICD-10-CM | POA: Diagnosis present

## 2018-04-18 DIAGNOSIS — R509 Fever, unspecified: Secondary | ICD-10-CM | POA: Diagnosis not present

## 2018-04-18 DIAGNOSIS — M549 Dorsalgia, unspecified: Secondary | ICD-10-CM | POA: Diagnosis present

## 2018-04-18 DIAGNOSIS — A90 Dengue fever [classical dengue]: Secondary | ICD-10-CM | POA: Diagnosis present

## 2018-04-18 DIAGNOSIS — D72819 Decreased white blood cell count, unspecified: Secondary | ICD-10-CM | POA: Diagnosis present

## 2018-04-18 DIAGNOSIS — R7989 Other specified abnormal findings of blood chemistry: Secondary | ICD-10-CM

## 2018-04-18 DIAGNOSIS — R040 Epistaxis: Secondary | ICD-10-CM | POA: Diagnosis present

## 2018-04-18 DIAGNOSIS — R5383 Other fatigue: Secondary | ICD-10-CM | POA: Diagnosis present

## 2018-04-18 DIAGNOSIS — R5381 Other malaise: Secondary | ICD-10-CM | POA: Diagnosis present

## 2018-04-18 DIAGNOSIS — Z90710 Acquired absence of both cervix and uterus: Secondary | ICD-10-CM | POA: Diagnosis not present

## 2018-04-18 DIAGNOSIS — R740 Nonspecific elevation of levels of transaminase and lactic acid dehydrogenase [LDH]: Secondary | ICD-10-CM | POA: Diagnosis present

## 2018-04-18 DIAGNOSIS — Z888 Allergy status to other drugs, medicaments and biological substances status: Secondary | ICD-10-CM | POA: Diagnosis not present

## 2018-04-18 DIAGNOSIS — R1011 Right upper quadrant pain: Secondary | ICD-10-CM

## 2018-04-18 DIAGNOSIS — D6959 Other secondary thrombocytopenia: Secondary | ICD-10-CM | POA: Diagnosis present

## 2018-04-18 DIAGNOSIS — R112 Nausea with vomiting, unspecified: Secondary | ICD-10-CM | POA: Diagnosis not present

## 2018-04-18 DIAGNOSIS — D509 Iron deficiency anemia, unspecified: Secondary | ICD-10-CM | POA: Diagnosis present

## 2018-04-18 DIAGNOSIS — Z8262 Family history of osteoporosis: Secondary | ICD-10-CM | POA: Diagnosis not present

## 2018-04-18 DIAGNOSIS — Z9889 Other specified postprocedural states: Secondary | ICD-10-CM | POA: Diagnosis not present

## 2018-04-18 LAB — LIPASE, BLOOD: Lipase: 35 U/L (ref 11–51)

## 2018-04-18 LAB — URINALYSIS, COMPLETE (UACMP) WITH MICROSCOPIC
Bilirubin Urine: NEGATIVE
Glucose, UA: NEGATIVE mg/dL
Ketones, ur: NEGATIVE mg/dL
LEUKOCYTES UA: NEGATIVE
Nitrite: NEGATIVE
Protein, ur: 30 mg/dL — AB
Specific Gravity, Urine: 1.008 (ref 1.005–1.030)
pH: 7 (ref 5.0–8.0)

## 2018-04-18 LAB — COMPREHENSIVE METABOLIC PANEL
ALBUMIN: 3.4 g/dL — AB (ref 3.5–5.0)
ALT: 104 U/L — ABNORMAL HIGH (ref 0–44)
AST: 197 U/L — ABNORMAL HIGH (ref 15–41)
Alkaline Phosphatase: 108 U/L (ref 38–126)
Anion gap: 8 (ref 5–15)
BUN: 6 mg/dL (ref 6–20)
CHLORIDE: 100 mmol/L (ref 98–111)
CO2: 26 mmol/L (ref 22–32)
Calcium: 8.5 mg/dL — ABNORMAL LOW (ref 8.9–10.3)
Creatinine, Ser: 0.45 mg/dL (ref 0.44–1.00)
GFR calc Af Amer: >60 mL/min (ref >60)
GFR calc non Af Amer: >60 mL/min (ref >60)
Glucose, Bld: 104 mg/dL — ABNORMAL HIGH (ref 70–99)
Potassium: 3.7 mmol/L (ref 3.5–5.1)
Sodium: 134 mmol/L — ABNORMAL LOW (ref 135–145)
Total Bilirubin: 0.7 mg/dL (ref 0.3–1.2)
Total Protein: 7 g/dL (ref 6.5–8.1)

## 2018-04-18 LAB — TYPE AND SCREEN
ABO/RH(D): O POS
ANTIBODY SCREEN: NEGATIVE

## 2018-04-18 LAB — CBC WITH DIFFERENTIAL/PLATELET
Abs Immature Granulocytes: 0.03 10*3/uL (ref 0.00–0.07)
Basophils Absolute: 0 10*3/uL (ref 0.0–0.1)
Basophils Relative: 1 %
Eosinophils Absolute: 0.1 10*3/uL (ref 0.0–0.5)
Eosinophils Relative: 2 %
HCT: 38.5 % (ref 36.0–46.0)
Hemoglobin: 12.9 g/dL (ref 12.0–15.0)
Immature Granulocytes: 1 %
Lymphocytes Relative: 46 %
Lymphs Abs: 1.7 10*3/uL (ref 0.7–4.0)
MCH: 29 pg (ref 26.0–34.0)
MCHC: 33.5 g/dL (ref 30.0–36.0)
MCV: 86.5 fL (ref 80.0–100.0)
Monocytes Absolute: 0.4 10*3/uL (ref 0.1–1.0)
Monocytes Relative: 10 %
Neutro Abs: 1.4 10*3/uL — ABNORMAL LOW (ref 1.7–7.7)
Neutrophils Relative %: 40 %
Platelets: 9 10*3/uL — CL (ref 150–400)
RBC: 4.45 MIL/uL (ref 3.87–5.11)
RDW: 12.7 % (ref 11.5–15.5)
WBC Morphology: >10
WBC: 3.6 10*3/uL — ABNORMAL LOW (ref 4.0–10.5)
nRBC: 0 % (ref 0.0–0.2)

## 2018-04-18 LAB — CBC
HEMATOCRIT: 40.5 % (ref 36.0–46.0)
Hemoglobin: 13.5 g/dL (ref 12.0–15.0)
MCH: 29 pg (ref 26.0–34.0)
MCHC: 33.3 g/dL (ref 30.0–36.0)
MCV: 87.1 fL (ref 80.0–100.0)
Platelets: 11 10*3/uL — CL (ref 150–400)
RBC: 4.65 MIL/uL (ref 3.87–5.11)
RDW: 12.9 % (ref 11.5–15.5)
WBC: 3.4 10*3/uL — ABNORMAL LOW (ref 4.0–10.5)
nRBC: 0 % (ref 0.0–0.2)

## 2018-04-18 LAB — PROTIME-INR
INR: 0.93
Prothrombin Time: 12.4 seconds (ref 11.4–15.2)

## 2018-04-18 LAB — TROPONIN I: Troponin I: <0.03 ng/mL (ref <0.03)

## 2018-04-18 LAB — HEMOGLOBIN A1C
Hgb A1c MFr Bld: 5.6 % (ref 4.8–5.6)
Mean Plasma Glucose: 114.02 mg/dL

## 2018-04-18 LAB — TSH: TSH: 2.664 u[IU]/mL (ref 0.350–4.500)

## 2018-04-18 MED ORDER — ONDANSETRON HCL 4 MG/2ML IJ SOLN: 4.0000 mg | Freq: Four times a day (QID) | INTRAMUSCULAR | Status: DC | PRN

## 2018-04-18 MED ORDER — DEXAMETHASONE 4 MG PO TABS
20.0000 mg | ORAL_TABLET | Freq: Once | ORAL | Status: AC
  Administered 2018-04-18: 20 mg via ORAL
  Filled 2018-04-18: qty 5

## 2018-04-18 MED ORDER — ONDANSETRON HCL 4 MG PO TABS: 4.0000 mg | ORAL_TABLET | Freq: Four times a day (QID) | ORAL | Status: DC | PRN

## 2018-04-18 MED ORDER — OXYCODONE HCL 5 MG PO TABS: 5.0000 mg | ORAL_TABLET | Freq: Four times a day (QID) | ORAL | Status: DC | PRN

## 2018-04-18 MED ORDER — SODIUM CHLORIDE 0.9% IV SOLUTION
Freq: Once | INTRAVENOUS | Status: AC
  Administered 2018-04-18: 03:00:00 via INTRAVENOUS
  Filled 2018-04-18: qty 250

## 2018-04-18 MED ORDER — GABAPENTIN 300 MG PO CAPS
300.0000 mg | ORAL_CAPSULE | Freq: Three times a day (TID) | ORAL | Status: DC | PRN
  Administered 2018-04-18 – 2018-04-19 (×2): 300 mg via ORAL
  Filled 2018-04-18 (×2): qty 1

## 2018-04-18 MED ORDER — ACETAMINOPHEN 325 MG PO TABS: 650.0000 mg | ORAL_TABLET | Freq: Four times a day (QID) | ORAL | Status: DC | PRN

## 2018-04-18 MED ORDER — DEXAMETHASONE SODIUM PHOSPHATE 10 MG/ML IJ SOLN
20.0000 mg | Freq: Once | INTRAMUSCULAR | Status: AC
  Administered 2018-04-18: 20 mg via INTRAVENOUS
  Filled 2018-04-18: qty 2

## 2018-04-18 MED ORDER — ACETAMINOPHEN 650 MG RE SUPP: 650.0000 mg | Freq: Four times a day (QID) | RECTAL | Status: DC | PRN

## 2018-04-18 MED ORDER — DOCUSATE SODIUM 100 MG PO CAPS
100.0000 mg | ORAL_CAPSULE | Freq: Two times a day (BID) | ORAL | Status: DC
  Administered 2018-04-18 – 2018-04-19 (×2): 100 mg via ORAL
  Filled 2018-04-18 (×3): qty 1

## 2018-04-18 MED ORDER — IOPAMIDOL (ISOVUE-300) INJECTION 61%
100.0000 mL | Freq: Once | INTRAVENOUS | Status: AC | PRN
  Administered 2018-04-18: 100 mL via INTRAVENOUS

## 2018-04-18 NOTE — ED Notes (Signed)
Pt tolerating platelets well, rates increased to 472ml/hr per MD order. Will continue to closely monitor.

## 2018-04-18 NOTE — ED Notes (Addendum)
2nd bag of platelets initiated per hospital policy.

## 2018-04-18 NOTE — ED Notes (Signed)
Pt holding for admission, aware of bed status and understands she will be holding in the ED till bed becomes available. Pt positioned for comfort, and has no complaints at this time.

## 2018-04-18 NOTE — Consult Note (Signed)
[place] NOTE  Patient Care Team: Halfon, Guerry Bruin, MD (Inactive) as PCP - General (Obstetrics and Gynecology)  CHIEF COMPLAINTS/PURPOSE OF CONSULTATION:  Severe thrombocytopenia  HISTORY OF PRESENTING ILLNESS: Patient speaks limited English/daughter by the bedside. Monica Obrien 48 y.o.  female with no significant past medical history is currently seen in the emergency room for severe thrombocytopenia.  Patient states that she recently returned from [place] approximately 5 days ago.  Patient noted to have abdominal discomfort with nausea and vomiting for the last 4 to 5 days.  Low-grade fevers and joint pains.  Also noted to have a nosebleed at home-which stopped spontaneously.  Patient also noted to have rash on her bilateral lower extremities.  On evaluation the emergency room patient noted to have -platelets in the between 9-11; normal white count/hemoglobin.  Interesting the platelets was normal in 2019.  LFTs were elevated-AST ALT elevation with normal bilirubin.   Patient received 20 of IV Dex; and also received 1 unit of platelets.  Review of Systems  Constitutional: Negative for chills, diaphoresis, fever, malaise/fatigue and weight loss.  HENT: Negative for nosebleeds and sore throat.   Eyes: Negative for double vision.  Respiratory: Negative for cough, hemoptysis, sputum production, shortness of breath and wheezing.   Cardiovascular: Negative for chest pain, palpitations, orthopnea and leg swelling.  Gastrointestinal: Positive for abdominal pain, nausea and vomiting. Negative for blood in stool, constipation, diarrhea, heartburn and melena.  Genitourinary: Negative for dysuria, frequency and urgency.  Musculoskeletal: Negative for back pain and joint pain.  Skin: Positive for rash. Negative for itching.  Neurological: Negative for dizziness, tingling, focal weakness, weakness and headaches.  Endo/Heme/Allergies: Does not bruise/bleed easily.   Psychiatric/Behavioral: Negative for depression. The patient is not nervous/anxious and does not have insomnia.      MEDICAL HISTORY:  Past Medical History:  Diagnosis Date  . Anemia   . Complication of anesthesia    elevated blood pressure  . Difficult intubation   . Headache     SURGICAL HISTORY: Past Surgical History:  Procedure Laterality Date  . BILATERAL SALPINGECTOMY Bilateral 08/26/2015   Procedure: BILATERAL SALPINGECTOMY;  Surgeon: Lorette Ang, MD;  Location: [facility] ORS;  Service: Gynecology;  Laterality: Bilateral;  . CYSTOSCOPY  08/26/2015   Procedure: CYSTOSCOPY;  Surgeon: Lorette Ang, MD;  Location: [facility] OR[facility];  Service: Gynecology;;  . LAPAROSCOPIC HYSTERECTOMY N/A 08/26/2015   Procedure: HYSTERECTOMY TOTAL LAPAROSCOPIC;  Surgeon: Lorette Ang, MD;  Location: [facility] ORS;  Service: Gynecology;  Laterality: N/A;  . TUBAL LIGATION      SOCIAL HISTORY: Social History   Socioeconomic History  . Marital status: Divorced    Spouse name: Not on file  . Number of children: Not on file  . Years of education: Not on file  . Highest education level: Not on file  Occupational History  . Not on file  Social Needs  . Financial resource strain: Not on file  . Food insecurity:    Worry: Not on file    Inability: Not on file  . Transportation needs:    Medical: Not on file    Non-medical: Not on file  Tobacco Use  . Smoking status: Never Smoker  . Smokeless tobacco: Never Used  Substance and Sexual Activity  . Alcohol use: No  . Drug use: No  . Sexual activity: Not on file  Lifestyle  . Physical activity:    Days per week: Not on file    Minutes per session:  Not on file  . Stress: Not on file  Relationships  . Social connections:    Talks on phone: Not on file    Gets together: Not on file    Attends religious service: Not on file    Active member of club or organization: Not on file    Attends meetings of clubs or organizations: Not on file     Relationship status: Not on file  . Intimate partner violence:    Fear of current or ex partner: Not on file    Emotionally abused: Not on file    Physically abused: Not on file    Forced sexual activity: Not on file  Other Topics Concern  . Not on file  Social History Narrative  . Not on file    FAMILY HISTORY: Family History  Problem Relation Age of Onset  . Osteoporosis Mother     ALLERGIES:  is allergic to imitrex [sumatriptan].  MEDICATIONS:  Current Facility-Administered Medications  Medication Dose Route Frequency Provider Last Rate Last Dose  . acetaminophen (TYLENOL) tablet 650 mg  650 mg Oral Q6H PRN Harrie Foreman, MD       Or  . acetaminophen (TYLENOL) suppository 650 mg  650 mg Rectal Q6H PRN Harrie Foreman, MD      . docusate sodium (COLACE) capsule 100 mg  100 mg Oral BID Harrie Foreman, MD   100 mg at 04/18/18 1034  . ondansetron (ZOFRAN) tablet 4 mg  4 mg Oral Q6H PRN Harrie Foreman, MD       Or  . ondansetron [facility]) injection 4 mg  4 mg Intravenous Q6H PRN Harrie Foreman, MD       No current outpatient medications on file.      Marland Kitchen  PHYSICAL EXAMINATION:  Vitals:   04/18/18 0900 04/18/18 1000  BP: 117/79 104/68  Pulse: 69 63  Resp: (!) 21 16  Temp:    SpO2: 97% 96%   Filed Weights   04/18/18 0006  Weight: 165 lb (74.8 kg)    Physical Exam  Constitutional: She is oriented to person, place, and time and well-developed, well-nourished, and in no distress.  No acute distress.  Accompanied by daughter.  HENT:  Head: Normocephalic and atraumatic.  Mouth/Throat: Oropharynx is clear and moist. No oropharyngeal exudate.  Eyes: Pupils are equal, round, and reactive to light.  Neck: Normal range of motion. Neck supple.  Cardiovascular: Normal rate and regular rhythm.  Pulmonary/Chest: No respiratory distress. She has no wheezes.  Abdominal: Soft. Bowel sounds are normal. She exhibits no distension and no mass. There is no  abdominal tenderness. There is no rebound and no guarding.  Musculoskeletal: Normal range of motion.        General: No tenderness or edema.  Neurological: She is alert and oriented to person, place, and time.  Skin: Skin is warm.  Petechial rash bilateral lower extremities.  No bleeding in the oral mucosa.  Psychiatric: Affect normal.     LABORATORY DATA:  I have reviewed the data as listed Lab Results  Component Value Date   WBC 3.6 (L) 04/18/2018   HGB 12.9 04/18/2018   HCT 38.5 04/18/2018   MCV 86.5 04/18/2018   PLT 9 (LL) 04/18/2018   Recent Labs    04/18/18 0014  NA 134*  K 3.7  CL 100  CO2 26  GLUCOSE 104*  BUN 6  CREATININE 0.45  CALCIUM 8.5*  GFRNONAA >60  GFRAA >60  PROT  7.0  ALBUMIN 3.4*  AST 197*  ALT 104*  ALKPHOS 108  BILITOT 0.7    RADIOGRAPHIC STUDIES: I have personally reviewed the radiological images as listed and agreed with the findings in the report. Ct Head Wo Contrast  Result Date: 04/18/2018 CLINICAL DATA:  Cough, fatigue, upper abdominal pain EXAM: CT HEAD WITHOUT CONTRAST TECHNIQUE: Contiguous axial images were obtained from the base of the skull through the vertex without intravenous contrast. COMPARISON:  MRI 08/31/2015 FINDINGS: Brain: No evidence of acute infarction, hemorrhage, hydrocephalus, extra-axial collection or mass lesion/mass effect. Vascular: Hyperdense vessels and venous sinuses due to previous administration of intravenous contrast. This limits the study. Skull: Normal. Negative for fracture or focal lesion. Sinuses/Orbits: No acute finding. Other: None IMPRESSION: 1. Slightly limited exam secondary to recent administration of intravenous contrast 2. No definite CT evidence for acute intracranial abnormality. Electronically Signed   By: Donavan Foil M.D.   On: 04/18/2018 03:07   Ct Abdomen Pelvis W Contrast  Result Date: 04/18/2018 CLINICAL DATA:  Abdomen pain EXAM: CT ABDOMEN AND PELVIS WITH CONTRAST TECHNIQUE:  Multidetector CT imaging of the abdomen and pelvis was performed using the standard protocol following bolus administration of intravenous contrast. CONTRAST:  163mL ISOVUE-300 IOPAMIDOL (ISOVUE-300) INJECTION 61% COMPARISON:  CT 01/06/2017 FINDINGS: Lower chest: Lung bases demonstrate no acute consolidation or effusion. The heart size is normal. Stable 3 mm right lower lobe subpleural pulmonary nodule. Hepatobiliary: Stable subcentimeter left hepatic lobe lesion, likely a small cyst. Prominent gallbladder wall thickening is present. No biliary dilatation Pancreas: Unremarkable. No pancreatic ductal dilatation or surrounding inflammatory changes. Spleen: Normal in size without focal abnormality. Adrenals/Urinary Tract: Adrenal glands are unremarkable. Kidneys are normal, without renal calculi, focal lesion, or hydronephrosis. Bladder is unremarkable. Stomach/Bowel: The stomach is within normal limits. No dilated small bowel. No colon wall thickening. Negative appendix. Vascular/Lymphatic: Nonaneurysmal aorta. No significantly enlarged lymph nodes. Reproductive: Status post hysterectomy. Stable 18 mm left adnexal cyst. Other: Negative for free air or free fluid. Musculoskeletal: No acute or significant osseous findings. IMPRESSION: 1. Prominent gallbladder wall thickening for which ultrasound correlation is recommended. 2. Stable peripheral right lower lobe 3 mm lung nodule, felt benign given lack of interval change. Electronically Signed   By: Donavan Foil M.D.   On: 04/18/2018 03:01   US Abdomen Limited Ruq  Result Date: 04/18/2018 CLINICAL DATA:  Right upper quadrant pain EXAM: ULTRASOUND ABDOMEN LIMITED RIGHT UPPER QUADRANT COMPARISON:  None. FINDINGS: Gallbladder: No gallstones are evident. The wall of the gallbladder is thickened edematous. There is equivocal pericholecystic fluid. No sonographic Murphy sign noted by sonographer. Common bile duct: Diameter: 4 mm. No intrahepatic or extrahepatic biliary  duct dilatation. Liver: No focal lesion identified. Liver echogenicity is increased. Portal vein is patent on color Doppler imaging with normal direction of blood flow towards the liver. IMPRESSION: 1. Gallbladder wall is thickened and edematous with subtle pericholecystic fluid. No gallstones seen. The appearance is concerning for acalculus cholecystitis. 2. Diffuse increase in liver echogenicity, a finding indicative of hepatic steatosis. Electronically Signed   By: Lowella Grip III M.D.   On: 04/18/2018 08:08    Thrombocytopenia [facility]) #48 year old Hispanic female patient currently admitted to hospital for nausea vomiting abdominal discomfort-noted to have severe thrombocytopenia platelets 9-11.  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  Patient currently status post dexamethasone IV 20 mg; and also 1 unit of platelet transfusion.  Recommend 20 mg of p.o. dexamethasone today; and then p.o. 40 mg dexamethasone daily x4  days.  #Elevated LFTs-AST ALT without bilirubin.-Question viral infection.  Monitor closely.  #Nausea vomiting abdominal discomfort/myalgias arthralgias-question viral infection vs-others given international travel.   Thank you Dr.Chen for allowing me to participate in the care of your pleasant patient. Please do not hesitate to contact me with questions or concerns in the interim. Discussed with Dr.Chen.    All questions were answered. The patient knows to call the clinic with any problems, questions or concerns.    Cammie Sickle, MD 04/18/2018 3:01 PM

## 2018-04-18 NOTE — ED Notes (Signed)
Pt tolerating platelets well, rates increased to 245ml/hr.

## 2018-04-18 NOTE — ED Notes (Signed)
Blood consent signed

## 2018-04-18 NOTE — Progress Notes (Signed)
Per Dr. Dahlia Byes patient presents with symptoms of Dengue virus. Paged on call ID for further instructions.

## 2018-04-18 NOTE — H&P (Signed)
Monica Obrien is an 48 y.o. female.   Chief Complaint: Abdominal pain HPI: The patient with past medical history of anemia presents the emergency department complaining of abdominal pain and body aches.  Patient reports that she return from [place] a few days ago.  Approximately 5 days ago she developed fever and body aches.  Today she complains of generalized malaise as well as abdominal pain.  She denies nausea or vomiting.  Laboratory evaluation showed severely low platelet count compared to previous data.  It was rechecked and found to be 9000.  The patient admits to having a nosebleed yesterday.  She denies seeing blood in her stool.  Further evaluation revealed anemia as well as mild leukopenia.  The patient was given steroids in the emergency department in addition to a platelet transfusion prior to the emergency department staff calling the hospitalist service for admission.  Past Medical History:  Diagnosis Date  . Anemia   . Complication of anesthesia    elevated blood pressure  . Difficult intubation   . Headache     Past Surgical History:  Procedure Laterality Date  . BILATERAL SALPINGECTOMY Bilateral 08/26/2015   Procedure: BILATERAL SALPINGECTOMY;  Surgeon: Lorette Ang, MD;  Location: [facility] ORS;  Service: Gynecology;  Laterality: Bilateral;  . CYSTOSCOPY  08/26/2015   Procedure: CYSTOSCOPY;  Surgeon: Lorette Ang, MD;  Location: [facility] ORS;  Service: Gynecology;;  . LAPAROSCOPIC HYSTERECTOMY N/A 08/26/2015   Procedure: HYSTERECTOMY TOTAL LAPAROSCOPIC;  Surgeon: Lorette Ang, MD;  Location: [facility] ORS;  Service: Gynecology;  Laterality: N/A;  . TUBAL LIGATION      Family History  Problem Relation Age of Onset  . Osteoporosis Mother    Social History:  reports that she has never smoked. She has never used smokeless tobacco. She reports that she does not drink alcohol or use drugs.  Allergies:  Allergies  Allergen Reactions  . Imitrex [Sumatriptan] Swelling     (Not in a hospital admission)   Results for orders placed or performed during the hospital encounter of 04/18/18 (from the past 48 hour(s))  Prepare Pheresed Platelets     Status: None (Preliminary result)   Collection Time: 04/17/18 12:14 AM  Result Value Ref Range   Unit Number [id number]    Blood Component Type PLTPHER LR1    Unit division 00    Status of Unit ISSUED    Transfusion Status      OK TO TRANSFUSE Performed at [facility], [address] [place], [place] [address]   Lipase, blood     Status: None   Collection Time: 04/18/18 12:14 AM  Result Value Ref Range   Lipase 35 11 - 51 U/L    Comment: Performed at [facility], [address] [place], [place] [address]  Comprehensive metabolic panel     Status: Abnormal   Collection Time: 04/18/18 12:14 AM  Result Value Ref Range   Sodium 134 (L) 135 - 145 mmol/L   Potassium 3.7 3.5 - 5.1 mmol/L   Chloride 100 98 - 111 mmol/L   CO2 26 22 - 32 mmol/L   Glucose, Bld 104 (H) 70 - 99 mg/dL   BUN 6 6 - 20 mg/dL   Creatinine, Ser 0.45 0.44 - 1.00 mg/dL   Calcium 8.5 (L) 8.9 - 10.3 mg/dL   Total Protein 7.0 6.5 - 8.1 g/dL   Albumin 3.4 (L) 3.5 - 5.0 g/dL   AST 197 (H) 15 - 41 U/L   ALT  104 (H) 0 - 44 U/L   Alkaline Phosphatase 108 38 - 126 U/L   Total Bilirubin 0.7 0.3 - 1.2 mg/dL   GFR calc non Af Amer >60 >60 mL/min   GFR calc Af Amer >60 >60 mL/min   Anion gap 8 5 - 15    Comment: Performed at [facility], [address] [place], [place] [address]  CBC     Status: Abnormal   Collection Time: 04/18/18 12:14 AM  Result Value Ref Range   WBC 3.4 (L) 4.0 - 10.5 K/uL   RBC 4.65 3.87 - 5.11 MIL/uL   Hemoglobin 13.5 12.0 - 15.0 g/dL   HCT 40.5 36.0 - 46.0 %   MCV 87.1 80.0 - 100.0 fL   MCH 29.0 26.0 - 34.0 pg   MCHC 33.3 30.0 - 36.0 g/dL   RDW 12.9 11.5 - 15.5 %   Platelets 11 (LL) 150 - 400 K/uL    Comment: Immature Platelet Fraction may be clinically indicated,  consider ordering this additional test [id number] PLT COUNT CONFIRMED BY SMEAR THIS CRITICAL RESULT HAS VERIFIED AND BEEN CALLED TO LISA THOMPSON BY MICHELE KENDALL ON 01 10 2020 AT 0112, AND HAS BEEN READ BACK.     nRBC 0.0 0.0 - 0.2 %    Comment: Performed at [facility], [address] [place], [place] [address]  Urinalysis, Complete w Microscopic     Status: Abnormal   Collection Time: 04/18/18 12:14 AM  Result Value Ref Range   Color, Urine YELLOW (A) YELLOW   APPearance CLEAR (A) CLEAR   Specific Gravity, Urine 1.008 1.005 - 1.030   pH 7.0 5.0 - 8.0   Glucose, UA NEGATIVE NEGATIVE mg/dL   Hgb urine dipstick MODERATE (A) NEGATIVE   Bilirubin Urine NEGATIVE NEGATIVE   Ketones, ur NEGATIVE NEGATIVE mg/dL   Protein, ur 30 (A) NEGATIVE mg/dL   Nitrite NEGATIVE NEGATIVE   Leukocytes, UA NEGATIVE NEGATIVE   RBC / HPF 0-5 0 - 5 RBC/hpf   WBC, UA 6-10 0 - 5 WBC/hpf   Bacteria, UA RARE (A) NONE SEEN   Squamous Epithelial / LPF 0-5 0 - 5   Mucus PRESENT     Comment: Performed at [facility], [address] [place], [place] [address]  Troponin I - ONCE - STAT     Status: None   Collection Time: 04/18/18 12:14 AM  Result Value Ref Range   Troponin I <0.03 <0.03 ng/mL    Comment: Performed at [facility], [address] [place], [place] [address]  CBC with Differential/Platelet     Status: Abnormal   Collection Time: 04/18/18  1:35 AM  Result Value Ref Range   WBC 3.6 (L) 4.0 - 10.5 K/uL   RBC 4.45 3.87 - 5.11 MIL/uL   Hemoglobin 12.9 12.0 - 15.0 g/dL   HCT 38.5 36.0 - 46.0 %   MCV 86.5 80.0 - 100.0 fL   MCH 29.0 26.0 - 34.0 pg   MCHC 33.5 30.0 - 36.0 g/dL   RDW 12.7 11.5 - 15.5 %   Platelets 9 (LL) 150 - 400 K/uL    Comment: Immature Platelet Fraction may be clinically indicated, consider ordering this additional test [id number] CRITICAL VALUE NOTED.  VALUE IS CONSISTENT WITH PREVIOUSLY REPORTED AND CALLED VALUE.    nRBC 0.0 0.0 - 0.2 %    Neutrophils Relative % 40 %   Neutro Abs 1.4 (L) 1.7 - 7.7 K/uL   Lymphocytes Relative 46 %  Lymphs Abs 1.7 0.7 - 4.0 K/uL   Monocytes Relative 10 %   Monocytes Absolute 0.4 0.1 - 1.0 K/uL   Eosinophils Relative 2 %   Eosinophils Absolute 0.1 0.0 - 0.5 K/uL   Basophils Relative 1 %   Basophils Absolute 0.0 0.0 - 0.1 K/uL   WBC Morphology >10% Reactive Benign Lymphoctyes    RBC Morphology MORPHOLOGY UNREMARKABLE     Comment: NO SCHISTOCYTES SEEN   Smear Review PLATELET COUNT CONFIRMED BY SMEAR    Immature Granulocytes 1 %   Abs Immature Granulocytes 0.03 0.00 - 0.07 K/uL    Comment: Performed at [facility], [address] [place], [place] [address]  Type and screen [place]     Status: None   Collection Time: 04/18/18  1:35 AM  Result Value Ref Range   ABO/RH(D) O POS    Antibody Screen NEG    Sample Expiration      04/21/2018 Performed at [facility], [address] [place], [place] [address]   Protime-INR     Status: None   Collection Time: 04/18/18  1:35 AM  Result Value Ref Range   Prothrombin Time 12.4 11.4 - 15.2 seconds   INR 0.93     Comment: Performed at [facility], [address] [place], [place] [address]  Prepare Pheresed Platelets     Status: None (Preliminary result)   Collection Time: 04/18/18  6:15 AM  Result Value Ref Range   Unit Number [id number]    Blood Component Type PLTPHER LR1    Unit division 00    Status of Unit ISSUED    Transfusion Status      OK TO TRANSFUSE Performed at [facility], [place], [place] [address]    Ct Head Wo Contrast  Result Date: 04/18/2018 CLINICAL DATA:  Cough, fatigue, upper abdominal pain EXAM: CT HEAD WITHOUT CONTRAST TECHNIQUE: Contiguous axial images were obtained from the base of the skull through the vertex without intravenous contrast. COMPARISON:  MRI 08/31/2015 FINDINGS: Brain: No evidence of acute infarction, hemorrhage,  hydrocephalus, extra-axial collection or mass lesion/mass effect. Vascular: Hyperdense vessels and venous sinuses due to previous administration of intravenous contrast. This limits the study. Skull: Normal. Negative for fracture or focal lesion. Sinuses/Orbits: No acute finding. Other: None IMPRESSION: 1. Slightly limited exam secondary to recent administration of intravenous contrast 2. No definite CT evidence for acute intracranial abnormality. Electronically Signed   By: Donavan Foil M.D.   On: 04/18/2018 03:07   Ct Abdomen Pelvis W Contrast  Result Date: 04/18/2018 CLINICAL DATA:  Abdomen pain EXAM: CT ABDOMEN AND PELVIS WITH CONTRAST TECHNIQUE: Multidetector CT imaging of the abdomen and pelvis was performed using the standard protocol following bolus administration of intravenous contrast. CONTRAST:  185mL ISOVUE-300 IOPAMIDOL (ISOVUE-300) INJECTION 61% COMPARISON:  CT 01/06/2017 FINDINGS: Lower chest: Lung bases demonstrate no acute consolidation or effusion. The heart size is normal. Stable 3 mm right lower lobe subpleural pulmonary nodule. Hepatobiliary: Stable subcentimeter left hepatic lobe lesion, likely a small cyst. Prominent gallbladder wall thickening is present. No biliary dilatation Pancreas: Unremarkable. No pancreatic ductal dilatation or surrounding inflammatory changes. Spleen: Normal in size without focal abnormality. Adrenals/Urinary Tract: Adrenal glands are unremarkable. Kidneys are normal, without renal calculi, focal lesion, or hydronephrosis. Bladder is unremarkable. Stomach/Bowel: The stomach is within normal limits. No dilated small bowel. No colon wall thickening. Negative appendix. Vascular/Lymphatic: Nonaneurysmal aorta. No significantly enlarged lymph nodes. Reproductive: Status post hysterectomy.  Stable 18 mm left adnexal cyst. Other: Negative for free air or free fluid. Musculoskeletal: No acute or significant osseous findings. IMPRESSION: 1. Prominent gallbladder wall  thickening for which ultrasound correlation is recommended. 2. Stable peripheral right lower lobe 3 mm lung nodule, felt benign given lack of interval change. Electronically Signed   By: Donavan Foil M.D.   On: 04/18/2018 03:01   US Abdomen Limited Ruq  Result Date: 04/18/2018 CLINICAL DATA:  Right upper quadrant pain EXAM: ULTRASOUND ABDOMEN LIMITED RIGHT UPPER QUADRANT COMPARISON:  None. FINDINGS: Gallbladder: No gallstones are evident. The wall of the gallbladder is thickened edematous. There is equivocal pericholecystic fluid. No sonographic Murphy sign noted by sonographer. Common bile duct: Diameter: 4 mm. No intrahepatic or extrahepatic biliary duct dilatation. Liver: No focal lesion identified. Liver echogenicity is increased. Portal vein is patent on color Doppler imaging with normal direction of blood flow towards the liver. IMPRESSION: 1. Gallbladder wall is thickened and edematous with subtle pericholecystic fluid. No gallstones seen. The appearance is concerning for acalculus cholecystitis. 2. Diffuse increase in liver echogenicity, a finding indicative of hepatic steatosis. Electronically Signed   By: Lowella Grip III M.D.   On: 04/18/2018 08:08    Review of Systems  Constitutional: Positive for fever and malaise/fatigue. Negative for chills.  HENT: Positive for nosebleeds. Negative for sore throat and tinnitus.   Eyes: Negative for blurred vision and redness.  Respiratory: Negative for cough and shortness of breath.   Cardiovascular: Negative for chest pain, palpitations, orthopnea and PND.  Gastrointestinal: Negative for abdominal pain, diarrhea, nausea and vomiting.  Genitourinary: Negative for dysuria, frequency and urgency.  Musculoskeletal: Negative for joint pain and myalgias.  Skin: Negative for rash.       No lesions  Neurological: Negative for speech change, focal weakness and weakness.  Endo/Heme/Allergies: Does not bruise/bleed easily.       No temperature  intolerance  Psychiatric/Behavioral: Negative for depression and suicidal ideas.    Blood pressure 112/72, pulse 68, temperature 97.6 F (36.4 C), resp. rate 16, height 5' (1.524 m), weight 74.8 kg, last menstrual period 06/08/2015, SpO2 95 %. Physical Exam  Vitals reviewed. Constitutional: She is oriented to person, place, and time. She appears well-developed and well-nourished. No distress.  HENT:  Head: Normocephalic and atraumatic.  Mouth/Throat: Oropharynx is clear and moist.  Eyes: Pupils are equal, round, and reactive to light. Conjunctivae and EOM are normal. No scleral icterus.  Neck: Normal range of motion. Neck supple. No JVD present. No tracheal deviation present. No thyromegaly present.  Cardiovascular: Normal rate, regular rhythm and normal heart sounds. Exam reveals no gallop and no friction rub.  No murmur heard. Respiratory: Effort normal and breath sounds normal.  GI: Soft. Bowel sounds are normal. She exhibits no distension. There is no abdominal tenderness.  Genitourinary:    Genitourinary Comments: Deferred   Musculoskeletal: Normal range of motion.        General: No edema.  Lymphadenopathy:    She has no cervical adenopathy.  Neurological: She is alert and oriented to person, place, and time. No cranial nerve deficit. She exhibits normal muscle tone.  Skin: Skin is warm and dry. No rash noted. No erythema.  Psychiatric: She has a normal mood and affect. Her behavior is normal. Judgment and thought content normal.     Assessment/Plan This is a 48 year old female admitted for thrombocytopenia. 1.  Thrombocytopenia: No current bleeding.  No indication of TTP (schistocytes negative smear).  Currently receiving platelet transfusion.  Patient reports that she has had blood transfusions in the past and that she has had doctors tell her that her bone marrow does not work well.  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  Differential diagnosis includes viral suppression of  bone marrow.  Check HIV status.  Consult hematology. 2.  Transaminitis: Indicates contaminant autoimmune process.  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  History of anemia: Supposedly iron deficiency. 4.  DVT prophylaxis: SCDs 5.  GI prophylaxis: None The patient is a full code.  Time spent on admission orders and patient care approximately 45 minutes  Harrie Foreman, MD 04/18/2018, 8:12 AM

## 2018-04-18 NOTE — ED Notes (Signed)
Pt in with co abd pain since Sunday with nausea. States also has abd distention and fever. Denies any vomiting or diarrhea, states also has chills and body aches. States just came back from [place] Tuesday, states water there is bottled. Hx of anemia in the past with hx of blood transfusion.

## 2018-04-18 NOTE — Assessment & Plan Note (Signed)
#  48 year old Hispanic female patient currently admitted to hospital for nausea vomiting abdominal discomfort-noted to have severe thrombocytopenia platelets 9-11.  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  Patient currently status post dexamethasone IV 20 mg; and also 1 unit of platelet transfusion.  Recommend 20 mg of p.o. dexamethasone today; and then p.o. 40 mg dexamethasone daily x4 days.  #Elevated LFTs-AST ALT without bilirubin.-Question viral infection.  Monitor closely.  #Nausea vomiting abdominal discomfort/myalgias arthralgias-question viral infection vs-others given international travel.   Thank you Dr.Chen for allowing me to participate in the care of your pleasant patient. Please do not hesitate to contact me with questions or concerns in the interim. Discussed with Dr.Chen.

## 2018-04-18 NOTE — ED Notes (Signed)
Report received, care of pt assumed.  Platelets infusing without difficulty.  Pt awaiting inpt bed placement, will continue to monitor.

## 2018-04-18 NOTE — Consult Note (Signed)
Patient ID: Monica Obrien, female   DOB: March 20, 1971, 48 y.o.   MRN: [id number]  HPI Monica Obrien is a 48 y.o. female saline  at the request of Dr. Bridgett Larsson.  Case discussed with him in detail.  Presented to the emergency room complaining of generalized abdominal pain and body aches.  Patient returned from [place] a few days ago and developed fever and also retro-ocular pain and gheadache.  She also reports that her abd pain is intermittent, generalized dull.  She did have some evidence of blood in her stool. Her laboratory values show evidence of severe thrombocytopenia 9000.  Mild elevation of the AST and ALT.  Part of the work-up including a CT scan of the abdomen and pelvis and ultrasound that I have both personally review showing evidence of some edema in the gallbladder.  No evidence of stones.  Normal common bile duct.  No evidence of splenomegaly. I have also review her previous labs from 3 months ago with a normal platelet count. She is currently eating supper. No nausea or vomiting no evidence of biliary obstruction. There is evidence of dengue fever and Zika virus in [place] where she just came from. She has received a PL transfusion  HPI  Past Medical History:  Diagnosis Date  . Anemia   . Complication of anesthesia    elevated blood pressure  . Difficult intubation   . Headache     Past Surgical History:  Procedure Laterality Date  . BILATERAL SALPINGECTOMY Bilateral 08/26/2015   Procedure: BILATERAL SALPINGECTOMY;  Surgeon: Lorette Ang, MD;  Location: [facility] ORS;  Service: Gynecology;  Laterality: Bilateral;  . CYSTOSCOPY  08/26/2015   Procedure: CYSTOSCOPY;  Surgeon: Lorette Ang, MD;  Location: [facility] ORS;  Service: Gynecology;;  . LAPAROSCOPIC HYSTERECTOMY N/A 08/26/2015   Procedure: HYSTERECTOMY TOTAL LAPAROSCOPIC;  Surgeon: Lorette Ang, MD;  Location: [facility] ORS;  Service: Gynecology;  Laterality: N/A;  . TUBAL LIGATION      Family History  Problem  Relation Age of Onset  . Osteoporosis Mother     Social History Social History   Tobacco Use  . Smoking status: Never Smoker  . Smokeless tobacco: Never Used  Substance Use Topics  . Alcohol use: No  . Drug use: No    Allergies  Allergen Reactions  . Imitrex [Sumatriptan] Swelling    Current Facility-Administered Medications  Medication Dose Route Frequency Provider Last Rate Last Dose  . acetaminophen (TYLENOL) tablet 650 mg  650 mg Oral Q6H PRN Harrie Foreman, MD       Or  . acetaminophen (TYLENOL) suppository 650 mg  650 mg Rectal Q6H PRN Harrie Foreman, MD      . dexamethasone (DECADRON) tablet 20 mg  20 mg Oral Once Charlaine Dalton R, MD      . docusate sodium (COLACE) capsule 100 mg  100 mg Oral BID Harrie Foreman, MD   100 mg at 04/18/18 1034  . ondansetron (ZOFRAN) tablet 4 mg  4 mg Oral Q6H PRN Harrie Foreman, MD       Or  . ondansetron [facility]) injection 4 mg  4 mg Intravenous Q6H PRN Harrie Foreman, MD         Review of Systems Full ROS  was asked and was negative except for the information on the HPI  Physical Exam Blood pressure 124/83, pulse 64, temperature 97.8 F (36.6 C), temperature source Oral, resp. rate 18, height 5' (1.524 m), weight 74.8 kg, last menstrual  period 06/08/2015, SpO2 97 %. CONSTITUTIONAL: NAD EYES: Pupils are equal, round, and reactive to light, Sclera are non-icteric. EARS, NOSE, MOUTH AND THROAT: The oropharynx is clear. The oral mucosa is pink and moist. Hearing is intact to voice. LYMPH NODES:  Lymph nodes in the neck are normal. RESPIRATORY:  Lungs are clear. There is normal respiratory effort, with equal breath sounds bilaterally, and without pathologic use of accessory muscles. CARDIOVASCULAR: Heart is regular without murmurs, gallops, or rubs. GI: The abdomen is  soft, Mild diffuse tenderness, no peritontiis. There is no hepatosplenomegaly. There are normal bowel sounds in all quadrants. GU: Rectal  deferred.   MUSCULOSKELETAL: Normal muscle strength and tone. No cyanosis or edema.   SKIN: Turgor is good and there are no pathologic skin lesions or ulcers. NEUROLOGIC: Motor and sensation is grossly normal. Cranial nerves are grossly intact. PSYCH:  Oriented to person, place and time. Affect is normal.  Data Reviewed  I have personally reviewed the patient's imaging, laboratory findings and medical records.    Assessment/Plan 48 year old female with clinical signs and symptoms distant with dengue fever, Zica virus.  All clinical picture is very classic for this disease.  I personally do not think that she is got a lymphoproliferative active issue at this time.  I do think that all the findings from the gallbladder are related to edema secondary to increase permeability from her primary disease process. I Do not recommend any surgical intervention at this time.   I have discussed the case with the hospitalist. F/U prn basis as an outpt.  Caroleen Hamman, MD FACS General Surgeon 04/18/2018, 6:29 PM

## 2018-04-18 NOTE — ED Provider Notes (Signed)
[facility] Emergency Department Provider Note ____   First MD Initiated Contact with Patient 04/18/18 0645     (approximate)  I have reviewed the triage vital signs and the nursing notes.   HISTORY  Chief Complaint Abdominal Pain and Headache    HPI Monica Obrien is a 48 y.o. female with below list of previous medical conditions presents to the emergency department with abdominal bloating and pressure predominantly in the upper abdomen with associated cough.  Patient states that she had a fever on Sunday that was subjective in nature.  Of note the patient returned from [place] last Thursday.  Patient stated starting on Sunday she started having abdominal bloating vomiting and generalized fatigue.  Patient denies any diarrhea.  Patient also admits to a nosebleed which occurred yesterday that spontaneously resolved.   Past Medical History:  Diagnosis Date  . Anemia   . Complication of anesthesia    elevated blood pressure  . Difficult intubation   . Headache     Patient Active Problem List   Diagnosis Date Noted  . Thrombocytopenia ([facility]) 04/18/2018  . S/P laparoscopic hysterectomy 08/26/2015    Past Surgical History:  Procedure Laterality Date  . BILATERAL SALPINGECTOMY Bilateral 08/26/2015   Procedure: BILATERAL SALPINGECTOMY;  Surgeon: Lorette Ang, MD;  Location: [facility] ORS;  Service: Gynecology;  Laterality: Bilateral;  . CYSTOSCOPY  08/26/2015   Procedure: CYSTOSCOPY;  Surgeon: Lorette Ang, MD;  Location: [facility] ORS;  Service: Gynecology;;  . LAPAROSCOPIC HYSTERECTOMY N/A 08/26/2015   Procedure: HYSTERECTOMY TOTAL LAPAROSCOPIC;  Surgeon: Lorette Ang, MD;  Location: [facility] ORS;  Service: Gynecology;  Laterality: N/A;  . TUBAL LIGATION      Prior to Admission medications   Not on File    Allergies Imitrex [sumatriptan]  No family history on file.  Social History Social History   Tobacco Use  . Smoking status: Never  Smoker  . Smokeless tobacco: Never Used  Substance Use Topics  . Alcohol use: No  . Drug use: No    Review of Systems Constitutional: No fever/chills Eyes: No visual changes. ENT: No sore throat.  Positive for nosebleed (spontaneously resolved cardiovascular: Denies chest pain. Respiratory: Denies shortness of breath. Gastrointestinal: Positive for abdominal distention discomfort and vomiting Genitourinary: Negative for dysuria. Musculoskeletal: Negative for neck pain.  Negative for back pain. Integumentary: Negative for rash. Neurological: Negative for headaches, focal weakness or numbness.   ____________________________________________   PHYSICAL EXAM:  VITAL SIGNS: ED Triage Vitals  Enc Vitals Group     BP 04/18/18 0005 122/86     Pulse Rate 04/18/18 0005 78     Resp 04/18/18 0005 18     Temp 04/18/18 0005 97.6 F (36.4 C)     Temp Source 04/18/18 0005 Oral     SpO2 04/18/18 0005 98 %     Weight 04/18/18 0006 74.8 kg (165 lb)     Height 04/18/18 0006 1.524 m (5')     Head Circumference --      Peak Flow --      Pain Score 04/18/18 0005 9     Pain Loc --      Pain Edu? --      Excl. in [place]? --     Constitutional: Alert and oriented. Well appearing and in no acute distress. Eyes: Conjunctivae are normal.  Mouth/Throat: Mucous membranes are moist.  Oropharynx non-erythematous. Neck: No stridor.   Cardiovascular: Normal rate, regular rhythm. Good peripheral circulation. Grossly normal heart  sounds. Respiratory: Normal respiratory effort.  No retractions. Lungs CTAB. Gastrointestinal: Right upper quadrant/left upper quadrant tenderness to palpation.  Positive distention. Musculoskeletal: No lower extremity tenderness nor edema. No gross deformities of extremities. Neurologic:  Normal speech and language. No gross focal neurologic deficits are appreciated.  Skin:  Skin is warm, dry and intact. No rash noted. Psychiatric: Mood and affect are normal. Speech and  behavior are normal.  ____________________________________________   LABS (all labs ordered are listed, but only abnormal results are displayed)  Labs Reviewed  COMPREHENSIVE METABOLIC PANEL - Abnormal; Notable for the following components:      Result Value   Sodium 134 (*)    Glucose, Bld 104 (*)    Calcium 8.5 (*)    Albumin 3.4 (*)    AST 197 (*)    ALT 104 (*)    All other components within normal limits  CBC - Abnormal; Notable for the following components:   WBC 3.4 (*)    Platelets 11 (*)    All other components within normal limits  URINALYSIS, COMPLETE (UACMP) WITH MICROSCOPIC - Abnormal; Notable for the following components:   Color, Urine YELLOW (*)    APPearance CLEAR (*)    Hgb urine dipstick MODERATE (*)    Protein, ur 30 (*)    Bacteria, UA RARE (*)    All other components within normal limits  CBC WITH DIFFERENTIAL/PLATELET - Abnormal; Notable for the following components:   WBC 3.6 (*)    Platelets 9 (*)    Neutro Abs 1.4 (*)    All other components within normal limits  GRAM STAIN  LIPASE, BLOOD  TROPONIN I  PROTIME-INR  PARASITE EXAM, BLOOD  TYPE AND SCREEN  PREPARE PLATELET PHERESIS  PREPARE PLATELET PHERESIS   ____________________________________________  EKG  ED ECG REPORT I, [place] N BROWN, the attending physician, personally viewed and interpreted this ECG.   Date: 04/18/2018  EKG Time: 12:03 AM  Rate: 73  Rhythm: Normal sinus rhythm  Axis: Normal  Intervals: Normal  ST&T Change: None  ____________________________________________  RADIOLOGY I, [place] N BROWN, personally viewed and evaluated these images (plain radiographs) as part of my medical decision making, as well as reviewing the written report by the radiologist.  ED MD interpretation: CT head revealed no acute intracranial abnormality per radiologist.  CT abdomen and pelvis revealed prominent gallbladder with wall thickening Official radiology report(s): Ct Head Wo  Contrast  Result Date: 04/18/2018 CLINICAL DATA:  Cough, fatigue, upper abdominal pain EXAM: CT HEAD WITHOUT CONTRAST TECHNIQUE: Contiguous axial images were obtained from the base of the skull through the vertex without intravenous contrast. COMPARISON:  MRI 08/31/2015 FINDINGS: Brain: No evidence of acute infarction, hemorrhage, hydrocephalus, extra-axial collection or mass lesion/mass effect. Vascular: Hyperdense vessels and venous sinuses due to previous administration of intravenous contrast. This limits the study. Skull: Normal. Negative for fracture or focal lesion. Sinuses/Orbits: No acute finding. Other: None IMPRESSION: 1. Slightly limited exam secondary to recent administration of intravenous contrast 2. No definite CT evidence for acute intracranial abnormality. Electronically Signed   By: Donavan Foil M.D.   On: 04/18/2018 03:07   Ct Abdomen Pelvis W Contrast  Result Date: 04/18/2018 CLINICAL DATA:  Abdomen pain EXAM: CT ABDOMEN AND PELVIS WITH CONTRAST TECHNIQUE: Multidetector CT imaging of the abdomen and pelvis was performed using the standard protocol following bolus administration of intravenous contrast. CONTRAST:  196mL ISOVUE-300 IOPAMIDOL (ISOVUE-300) INJECTION 61% COMPARISON:  CT 01/06/2017 FINDINGS: Lower chest: Lung bases demonstrate no  acute consolidation or effusion. The heart size is normal. Stable 3 mm right lower lobe subpleural pulmonary nodule. Hepatobiliary: Stable subcentimeter left hepatic lobe lesion, likely a small cyst. Prominent gallbladder wall thickening is present. No biliary dilatation Pancreas: Unremarkable. No pancreatic ductal dilatation or surrounding inflammatory changes. Spleen: Normal in size without focal abnormality. Adrenals/Urinary Tract: Adrenal glands are unremarkable. Kidneys are normal, without renal calculi, focal lesion, or hydronephrosis. Bladder is unremarkable. Stomach/Bowel: The stomach is within normal limits. No dilated small bowel. No colon  wall thickening. Negative appendix. Vascular/Lymphatic: Nonaneurysmal aorta. No significantly enlarged lymph nodes. Reproductive: Status post hysterectomy. Stable 18 mm left adnexal cyst. Other: Negative for free air or free fluid. Musculoskeletal: No acute or significant osseous findings. IMPRESSION: 1. Prominent gallbladder wall thickening for which ultrasound correlation is recommended. 2. Stable peripheral right lower lobe 3 mm lung nodule, felt benign given lack of interval change. Electronically Signed   By: Donavan Foil M.D.   On: 04/18/2018 03:01      Critical Care performed:   .Critical Care Performed by: Gregor Hams, MD Authorized by: Gregor Hams, MD   Critical care provider statement:    Critical care time (minutes):  120   Critical care time was exclusive of:  Separately billable procedures and treating other patients (Severe thrombocytopenia)   Critical care was time spent personally by me on the following activities:  Development of treatment plan with patient or surrogate, discussions with consultants, evaluation of patient's response to treatment, examination of patient, obtaining history from patient or surrogate, ordering and performing treatments and interventions, ordering and review of laboratory studies, ordering and review of radiographic studies, pulse oximetry, re-evaluation of patient's condition and review of old charts   I assumed direction of critical care for this patient from another provider in my specialty: no       ____________________________________________   INITIAL IMPRESSION / ASSESSMENT AND PLAN / ED COURSE  As part of my medical decision making, I reviewed the following data within the electronic MEDICAL RECORD NUMBER  48 year old female presenting with above-stated history and physical exam.  Patient noted to be thrombocytopenic on laboratory data with a platelet count of 11.  Repeat platelet count 9.  Consider the possibility of dengue  fever however patient afebrile at present.  Also considered possibility of other parasitic infections as the etiology given recent travel.  Also consider the possibility of ITP peripheral smear revealed no evidence of schistocytes.  Patient given Decadron and platelet transfusion while in the emergency department.  Patient discussed with Dr. Burlene Arnt  hematologist on-call who agreed with management thus far.  Dr. Burlene Arnt agreed to hospital admission and states that he will consult on the patient on the floor.  No available beds at [facility] at this time and as such continue to manage patient in the emergency department.  [facility] no available beds either as is [facility].  Patient discussed with Dr. Marcille Blanco for hospital admission. ____________________________________________  FINAL CLINICAL IMPRESSION(S) / ED DIAGNOSES  Final diagnoses:  Thrombocytopenia ([place])     MEDICATIONS GIVEN DURING THIS VISIT:  Medications  0.9 %  sodium chloride infusion (Manually program via Guardrails IV Fluids) ( Intravenous New Bag/Given 04/18/18 0314)  dexamethasone (DECADRON) injection 20 mg (20 mg Intravenous Given 04/18/18 0313)  iopamidol (ISOVUE-300) 61 % injection 100 mL (100 mLs Intravenous Contrast Given 04/18/18 0241)     ED Discharge Orders    None       Note:  This document  was prepared using Systems analyst and may include unintentional dictation errors.    Gregor Hams, MD 04/18/18 [phone]

## 2018-04-18 NOTE — ED Notes (Signed)
Platelets started per MD order, verified with 2nd RN per policy. Pt intrusted on transfusion reaction and symptoms. Started at 185ml/hr, will remain with pt 15 min and closely monitor.

## 2018-04-18 NOTE — Progress Notes (Addendum)
Better abdominal pain. Vital signs and labs reviewed. Physical examinations is unremarkable except mild abdominal tenderness in RUQ and epigastric area.  This is a 48 year old female admitted for thrombocytopenia. 1.  Thrombocytopenia, possible ITP: No current bleeding.  status post dexamethasone IV 20 mg; and also 1 unit of platelet transfusion.    Dr. Rogue Bussing recommends 20 mg of p.o. dexamethasone today; and then p.o. 40 mg dexamethasone daily x4 days.  2.  Acalculous cholecystitis with Transaminitis: Pain control.  Surgical consult. 3.  History of anemia: Supposedly iron deficiency.  Hemoglobin is normal. 4.  DVT prophylaxis: SCDs.  I discussed with the patient and her daughter. I discussed with Dr. Rogue Bussing.  Time spent about 26 minutes.

## 2018-04-18 NOTE — ED Notes (Signed)
Admitting MD Bridgett Larsson) at bedside with patient and family

## 2018-04-18 NOTE — ED Triage Notes (Signed)
Pt here with c/o bloating, pressure/pain in her upper abd, pain in upper back with cough, has a dry cough, did have fever on Sunday, just traveled back from [place] on Monday, has had vomiting described as "liquidy and sour," but none today, states she has been tired as well. Appears in NAD. Denies diarrhea.

## 2018-04-18 NOTE — ED Notes (Signed)
Platelet infusion complete at this time, pt to xray at this time

## 2018-04-18 NOTE — ED Notes (Signed)
PT ambulatory to BR without assistance.

## 2018-04-18 NOTE — ED Notes (Signed)
Pt to CT

## 2018-04-19 LAB — COMPREHENSIVE METABOLIC PANEL
ALT: 134 U/L — ABNORMAL HIGH (ref 0–44)
AST: 210 U/L — ABNORMAL HIGH (ref 15–41)
Albumin: 3.7 g/dL (ref 3.5–5.0)
Alkaline Phosphatase: 114 U/L (ref 38–126)
Anion gap: 10 (ref 5–15)
BUN: 9 mg/dL (ref 6–20)
CALCIUM: 9.3 mg/dL (ref 8.9–10.3)
CO2: 23 mmol/L (ref 22–32)
Chloride: 104 mmol/L (ref 98–111)
Creatinine, Ser: 0.41 mg/dL — ABNORMAL LOW (ref 0.44–1.00)
GFR calc Af Amer: >60 mL/min (ref >60)
GFR calc non Af Amer: >60 mL/min (ref >60)
GLUCOSE: 165 mg/dL — AB (ref 70–99)
Potassium: 3.9 mmol/L (ref 3.5–5.1)
Sodium: 137 mmol/L (ref 135–145)
Total Bilirubin: 0.6 mg/dL (ref 0.3–1.2)
Total Protein: 7.7 g/dL (ref 6.5–8.1)

## 2018-04-19 LAB — PREPARE PLATELET PHERESIS
Unit division: 0
Unit division: 0

## 2018-04-19 LAB — CBC
HCT: 39.4 % (ref 36.0–46.0)
Hemoglobin: 13.1 g/dL (ref 12.0–15.0)
MCH: 28.9 pg (ref 26.0–34.0)
MCHC: 33.2 g/dL (ref 30.0–36.0)
MCV: 87 fL (ref 80.0–100.0)
Platelets: 42 10*3/uL — ABNORMAL LOW (ref 150–400)
RBC: 4.53 MIL/uL (ref 3.87–5.11)
RDW: 12.9 % (ref 11.5–15.5)
WBC: 5.9 10*3/uL (ref 4.0–10.5)
nRBC: 0 % (ref 0.0–0.2)

## 2018-04-19 LAB — CBC WITH DIFFERENTIAL/PLATELET
Abs Immature Granulocytes: 0.05 10*3/uL (ref 0.00–0.07)
Basophils Absolute: 0 10*3/uL (ref 0.0–0.1)
Basophils Relative: 0 %
EOS ABS: 0 10*3/uL (ref 0.0–0.5)
Eosinophils Relative: 0 %
HCT: 37 % (ref 36.0–46.0)
Hemoglobin: 12.3 g/dL (ref 12.0–15.0)
Immature Granulocytes: 1 %
Lymphocytes Relative: 22 %
Lymphs Abs: 1.2 10*3/uL (ref 0.7–4.0)
MCH: 29.1 pg (ref 26.0–34.0)
MCHC: 33.2 g/dL (ref 30.0–36.0)
MCV: 87.7 fL (ref 80.0–100.0)
MONO ABS: 0.5 10*3/uL (ref 0.1–1.0)
Monocytes Relative: 9 %
Neutro Abs: 3.6 10*3/uL (ref 1.7–7.7)
Neutrophils Relative %: 68 %
PLATELETS: 33 10*3/uL — AB (ref 150–400)
RBC: 4.22 MIL/uL (ref 3.87–5.11)
RDW: 12.9 % (ref 11.5–15.5)
WBC: 5.3 10*3/uL (ref 4.0–10.5)
nRBC: 0 % (ref 0.0–0.2)

## 2018-04-19 LAB — BPAM PLATELET PHERESIS
Blood Product Expiration Date: 202001110438
Blood Product Expiration Date: 202001112359
ISSUE DATE / TIME: 202001100441
ISSUE DATE / TIME: 202001100624
Unit Type and Rh: 9500
Unit Type and Rh: 9500

## 2018-04-19 LAB — PROTIME-INR
INR: 1.03
Prothrombin Time: 13.4 seconds (ref 11.4–15.2)

## 2018-04-19 LAB — APTT: aPTT: 30 seconds (ref 24–36)

## 2018-04-19 MED ORDER — DIPHENHYDRAMINE HCL 25 MG PO CAPS: 25.0000 mg | ORAL_CAPSULE | Freq: Four times a day (QID) | ORAL | Status: DC | PRN

## 2018-04-19 NOTE — Plan of Care (Signed)
Discussed with patient and family plan of care for the evening, pain management and her complaints of itching and burning with her feet with some teach back displayed and prn pill given.  Will page MD about her itching in her hands and try and get a prn medication for it.

## 2018-04-19 NOTE — Progress Notes (Signed)
[facility] [place] NAME: Monica Obrien    MR#:  [id number]  DATE OF BIRTH:  November 07, 1970  SUBJECTIVE:  CHIEF COMPLAINT:   Chief Complaint  Patient presents with  . Abdominal Pain  . Headache   Lip  bleeding and left leg burning sensation. REVIEW OF SYSTEMS:  Review of Systems  Constitutional: Negative for chills, fever and malaise/fatigue.  HENT: Negative for sore throat.   Eyes: Negative for blurred vision and double vision.  Respiratory: Negative for cough, hemoptysis, shortness of breath, wheezing and stridor.   Cardiovascular: Negative for chest pain, palpitations, orthopnea and leg swelling.  Gastrointestinal: Negative for abdominal pain, blood in stool, diarrhea, melena, nausea and vomiting.  Genitourinary: Negative for dysuria, flank pain and hematuria.  Musculoskeletal: Negative for back pain and joint pain.  Skin: Negative for rash.  Neurological: Negative for dizziness, sensory change, focal weakness, seizures, loss of consciousness, weakness and headaches.  Endo/Heme/Allergies: Negative for polydipsia.  Psychiatric/Behavioral: Negative for depression. The patient is not nervous/anxious.     DRUG ALLERGIES:   Allergies  Allergen Reactions  . Imitrex [Sumatriptan] Swelling   VITALS:  Blood pressure 131/78, pulse 61, temperature 97.6 F (36.4 C), temperature source Axillary, resp. rate 18, height 5' (1.524 m), weight 69.9 kg, last menstrual period 06/08/2015, SpO2 96 %. PHYSICAL EXAMINATION:  Physical Exam Constitutional:      General: She is not in acute distress. HENT:     Head: Normocephalic.     Mouth/Throat:     Mouth: Mucous membranes are moist.  Eyes:     General: No scleral icterus.    Conjunctiva/sclera: Conjunctivae normal.     Pupils: Pupils are equal, round, and reactive to light.  Neck:     Musculoskeletal: Normal range of motion and neck supple.     Vascular: No JVD.     Trachea: No  tracheal deviation.  Cardiovascular:     Rate and Rhythm: Normal rate and regular rhythm.     Heart sounds: Normal heart sounds. No murmur. No gallop.   Pulmonary:     Effort: Pulmonary effort is normal. No respiratory distress.     Breath sounds: Normal breath sounds. No wheezing or rales.  Abdominal:     General: Bowel sounds are normal. There is no distension.     Palpations: Abdomen is soft.     Tenderness: There is no abdominal tenderness. There is no rebound.  Musculoskeletal: Normal range of motion.        General: No tenderness.     Right lower leg: No edema.     Left lower leg: No edema.  Skin:    Findings: No erythema or rash.  Neurological:     General: No focal deficit present.     Mental Status: She is alert and oriented to person, place, and time.     Cranial Nerves: No cranial nerve deficit.  Psychiatric:        Mood and Affect: Mood normal.    LABORATORY PANEL:  Female CBC Recent Labs  Lab 04/19/18 0428  WBC 5.9  HGB 13.1  HCT 39.4  PLT 42*   ------------------------------------------------------------------------------------------------------------------ Chemistries  Recent Labs  Lab 04/19/18 0428  NA 137  K 3.9  CL 104  CO2 23  GLUCOSE 165*  BUN 9  CREATININE 0.41*  CALCIUM 9.3  AST 210*  ALT 134*  ALKPHOS 114  BILITOT 0.6   RADIOLOGY:  No results found. ASSESSMENT AND PLAN:  This is a 48 year old female admitted for thrombocytopenia.  Dengue fever Symptomatic treatment.  PT PTT is unremarkable.  Peripheral blood smear is unremarkable per Dr. Ramon Dredge. No fever or chills, no active bleeding.  1. 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 status post dexamethasone IV 20 mg;and also 1 unit of platelet transfusion.   Dr. Rogue Bussing recommends 20 mg of p.o. dexamethasone;and then p.o. 40 mg dexamethasone daily x4 days.  Dexamethasone is discontinued.  Platelet increased to 42,000. Follow-up CBC  tomorrow.  2.  Acalculous cholecystitis withTransaminitis: Possible due to dengue fever and no indication for surgery per Dr. Dahlia Byes.  3. History of anemia: Supposedly iron deficiency.  Hemoglobin is normal. 4. DVT prophylaxis: SCDs.  I discussed with Dr. Steva Ready and Dr. Dahlia Byes.  With the help of Spanish interpreter. All the records are reviewed and case discussed with Care Management/Social Worker. Management plans discussed with the patient, her children and they are in agreement.  CODE STATUS: Full Code  TOTAL TIME TAKING CARE OF THIS PATIENT: 36 minutes.   More than 50% of the time was spent in counseling/coordination of care: YES  POSSIBLE D/C IN 1 DAYS, DEPENDING ON CLINICAL CONDITION.   Demetrios Loll M.D on 04/19/2018 at 2:08 PM  Between 7am to 6pm - Pager - [phone]  After 6pm go to [email or web address] - [facility]

## 2018-04-19 NOTE — Plan of Care (Signed)
Discussed earlier with patient and family plan of care care for the evening, pain management and airborne precautions with some teach back displayed.  Patient later complained of neck pain with itching and burning from the the knees towards the bottom of her feet.  MD made aware and medications given see MAR.

## 2018-04-19 NOTE — Progress Notes (Signed)
ID Spoke to patient on the phone with her and daughter Damaris Schooner to her nurse PT doing better , no fever, no body ache She was admitted 2 days ago with body ache, nausea, vomiting and fever Recent travel to [place] Had thrombocytopenia((9)  severe) on admission and abnormal AST/ALT- received PLt transfusion Dengue fever  in [place]-will send blood specimen to state  lab on Monday  No malaria parasites seen on the smear ( as per pathologist and lab staff) Will also check for HIV/Hepatits profile Symptomatic management only. PT/PTT N Spoke to Dr.Chen

## 2018-04-20 DIAGNOSIS — Z9889 Other specified postprocedural states: Secondary | ICD-10-CM

## 2018-04-20 DIAGNOSIS — R740 Nonspecific elevation of levels of transaminase and lactic acid dehydrogenase [LDH]: Secondary | ICD-10-CM

## 2018-04-20 DIAGNOSIS — D696 Thrombocytopenia, unspecified: Secondary | ICD-10-CM

## 2018-04-20 DIAGNOSIS — R509 Fever, unspecified: Secondary | ICD-10-CM

## 2018-04-20 DIAGNOSIS — R5383 Other fatigue: Secondary | ICD-10-CM

## 2018-04-20 DIAGNOSIS — M549 Dorsalgia, unspecified: Secondary | ICD-10-CM

## 2018-04-20 DIAGNOSIS — Z888 Allergy status to other drugs, medicaments and biological substances status: Secondary | ICD-10-CM

## 2018-04-20 LAB — CBC
HCT: 38.4 % (ref 36.0–46.0)
Hemoglobin: 12.4 g/dL (ref 12.0–15.0)
MCH: 28.4 pg (ref 26.0–34.0)
MCHC: 32.3 g/dL (ref 30.0–36.0)
MCV: 88.1 fL (ref 80.0–100.0)
Platelets: 88 10*3/uL — ABNORMAL LOW (ref 150–400)
RBC: 4.36 MIL/uL (ref 3.87–5.11)
RDW: 12.9 % (ref 11.5–15.5)
WBC: 9.6 10*3/uL (ref 4.0–10.5)
nRBC: 0 % (ref 0.0–0.2)

## 2018-04-20 MED ORDER — GABAPENTIN 300 MG PO CAPS: 300.0000 mg | ORAL_CAPSULE | Freq: Three times a day (TID) | ORAL | 0 refills | Status: DC | PRN

## 2018-04-20 MED ORDER — DIPHENHYDRAMINE HCL 25 MG PO CAPS: 25.0000 mg | ORAL_CAPSULE | Freq: Four times a day (QID) | ORAL | 0 refills | Status: DC | PRN

## 2018-04-20 NOTE — Consult Note (Addendum)
NAME: Monica Obrien  DOB: 08-Dec-1970  MRN: [id number]  Date/Time: 04/20/2018 8:47 AM Subjective:  REASON FOR CONSULT: ?dengue fever ?Daughter in the room  Monica Obrien is a 48 y.o.female  presented with fatigue, backpain , body aches of 5 days ,- fever of 4 days, abdominal pain of 1 day . She had an episode of vomiting on the day she came to the ED which was 04/18/18 Pt returned from [place] on 04/14/18 after staying there for 3 weeks  -was having fever since 04/13/18. In the ED did not have any fever. Had Platelet of 9. She was given steroids and blood transfusion in the ED.AST/ALT were high and imaging of the abdomen showed Prominent gallbladder wall thickening .Heme saw her and suspected viral VS ITP. Surgeon Dr.PAbon was called because of abdominal pain and acalculous cholecystitis and he suspected Dengue fever. I spoke to the pathologist yesterday and the smear had not shown any malaria parasites PT was management symptomatically and is  doing better No fever She says she never had dengue or malaria before  Past Medical History:  Diagnosis Date  . Anemia   . Complication of anesthesia    elevated blood pressure  . Difficult intubation   . Headache     Past Surgical History:  Procedure Laterality Date  . BILATERAL SALPINGECTOMY Bilateral 08/26/2015   Procedure: BILATERAL SALPINGECTOMY;  Surgeon: Lorette Ang, MD;  Location: [facility];  Service: Gynecology;  Laterality: Bilateral;  . CYSTOSCOPY  08/26/2015   Procedure: CYSTOSCOPY;  Surgeon: Lorette Ang, MD;  Location: [facility] OR[facility];  Service: Gynecology;;  . LAPAROSCOPIC HYSTERECTOMY N/A 08/26/2015   Procedure: HYSTERECTOMY TOTAL LAPAROSCOPIC;  Surgeon: Lorette Ang, MD;  Location: [facility] ORS;  Service: Gynecology;  Laterality: N/A;  . TUBAL LIGATION        SH Non smoker/no alcohol Works in [employer]  Family History  Problem Relation Age of Onset  . Osteoporosis Mother    Allergies  Allergen  Reactions  . Imitrex [Sumatriptan] Swelling  ? Current Facility-Administered Medications  Medication Dose Route Frequency Provider Last Rate Last Dose  . acetaminophen (TYLENOL) tablet 650 mg  650 mg Oral Q6H PRN Harrie Foreman, MD       Or  . acetaminophen (TYLENOL) suppository 650 mg  650 mg Rectal Q6H PRN Harrie Foreman, MD      . diphenhydrAMINE (BENADRYL) capsule 25 mg  25 mg Oral Q6H PRN Lance Coon, MD      . docusate sodium (COLACE) capsule 100 mg  100 mg Oral BID Harrie Foreman, MD   100 mg at 04/19/18 2259  . gabapentin (NEURONTIN) capsule 300 mg  300 mg Oral TID PRN Lance Coon, MD   300 mg at 04/19/18 2259  . ondansetron (ZOFRAN) tablet 4 mg  4 mg Oral Q6H PRN Harrie Foreman, MD       Or  . ondansetron [facility]) injection 4 mg  4 mg Intravenous Q6H PRN Harrie Foreman, MD      . oxyCODONE (Oxy IR/ROXICODONE) immediate release tablet 5 mg  5 mg Oral Q6H PRN Lance Coon, MD         Abtx:  Anti-infectives (From admission, onward)   None      REVIEW OF SYSTEMS:  Const: fever,  chills, negative weight loss Eyes: negative diplopia or visual changes, negative eye pain ENT: negative coryza, negative sore throat Resp: negative cough, hemoptysis, dyspnea Cards: negative for chest pain, palpitations, lower extremity edema GU:  negative for frequency, dysuria and hematuria GI: + abdominal pain, upper abdomen No diarrhea, bleeding, constipation Skin: fine rash legs Heme: +nasal bleed MS: myalgias, arthralgias, back pain and muscle weakness Neurolo:negative for headaches, dizziness, vertigo, memory problems  Psych: negative for feelings of anxiety, depression  Endocrine: negative for thyroid, diabetes Allergy/Immunology- negative for any medication or food allergies ?  Objective:  VITALS:  BP 109/71 (BP Location: Left Arm)   Pulse 67   Temp 98.2 F (36.8 C) (Oral)   Resp 17   Ht 5' (1.524 m)   Wt 69.7 kg   LMP 06/08/2015   SpO2 99%   BMI 30.01  kg/m  PHYSICAL EXAM:  General: Alert, cooperative, no distress, appears stated age.  Head: Normocephalic, without obvious abnormality, atraumatic. Eyes: Conjunctivae clear, anicteric sclerae. Pupils are equal ENT Nares normal. No drainage or sinus tenderness. Lips, mucosa, and tongue normal. No Thrush Neck: Supple, symmetrical, no adenopathy, thyroid: non tender no carotid bruit and no JVD. Back: No CVA tenderness. Lungs: Clear to auscultation bilaterally. No Wheezing or Rhonchi. No rales. Heart: Regular rate and rhythm, no murmur, rub or gallop. Abdomen: Soft, non-tender,not distended. Bowel sounds normal. No masses Extremities: atraumatic, no cyanosis. No edema. No clubbing Skin:follicular petechiae legs      Lymph: Cervical, supraclavicular normal. Neurologic: Grossly non-focal Pertinent Labs Lab Results CBC    Component Value Date/Time   WBC 9.6 04/20/2018 0455   RBC 4.36 04/20/2018 0455   HGB 12.4 04/20/2018 0455   HCT 38.4 04/20/2018 0455   PLT 88 (L) 04/20/2018 0455   MCV 88.1 04/20/2018 0455   MCH 28.4 04/20/2018 0455   MCHC 32.3 04/20/2018 0455   RDW 12.9 04/20/2018 0455   LYMPHSABS 1.2 04/18/2018 2234   MONOABS 0.5 04/18/2018 2234   EOSABS 0.0 04/18/2018 2234   BASOSABS 0.0 04/18/2018 2234   CBC Latest Ref Rng & Units 04/20/2018 04/19/2018 04/18/2018  WBC 4.0 - 10.5 K/uL 9.6 5.9 5.3  Hemoglobin 12.0 - 15.0 g/dL 12.4 13.1 12.3  Hematocrit 36.0 - 46.0 % 38.4 39.4 37.0  Platelets 150 - 400 K/uL 88(L) 42(L) 33(L)    CMP Latest Ref Rng & Units 04/19/2018 04/18/2018 01/06/2017  Glucose 70 - 99 mg/dL 165(H) 104(H) 102(H)  BUN 6 - 20 mg/dL 9 6 12   Creatinine 0.44 - 1.00 mg/dL 0.41(L) 0.45 0.57  Sodium 135 - 145 mmol/L 137 134(L) 136  Potassium 3.5 - 5.1 mmol/L 3.9 3.7 3.7  Chloride 98 - 111 mmol/L 104 100 104  CO2 22 - 32 mmol/L 23 26 24   Calcium 8.9 - 10.3 mg/dL 9.3 8.5(L) 9.0  Total Protein 6.5 - 8.1 g/dL 7.7 7.0 -  Total Bilirubin 0.3 - 1.2 mg/dL 0.6 0.7 -    Alkaline Phos 38 - 126 U/L 114 108 -  AST 15 - 41 U/L 210(H) 197(H) -  ALT 0 - 44 U/L 134(H) 104(H) -      Microbiology: No results found for this or any previous visit (from the past 240 hour(s)). IMAGING RESULTS: ?CT abdomen reviewed  Impression/Recommendation backpain , body aches of 5 days ,- fever of 4 days, abdominal pain of 1 day . She had an episode of vomiting on the day she came to the ED which was 04/18/18 Pt returned from [place] on 04/14/18 after staying there for 3 weeks  -was having fever since 04/13/18. In the ED did not have any fever. Had Platelet of 9.  Fever, body ache, back ache followed by low platelet and  increased transaminases Concerning for a viral infection- Dengue is very likely because of recent travel to [place] ( currently many cases of Dengue in [place])? Other viral infections like zika, chikungunya, or A les slikely Will send lblood to state lab on Monday for Viral illness  HIV and hepatitis pending ? Thrombocytopenia better and no fever Pt is being discharged today ? ___________________________________________________ Discussed with patient, her daughter and requesting provider

## 2018-04-20 NOTE — Progress Notes (Signed)
DISCHARGE NOTE:  Interpreter at the bedside. Pt given discharge instructions. Pt verbalized understanding. Pt wheeled to car by staff member.

## 2018-04-20 NOTE — Discharge Summary (Signed)
Monica Obrien at [place] NAME: Monica Obrien    MR#:  [id number]  DATE OF BIRTH:  Apr 04, 1971  DATE OF ADMISSION:  04/18/2018   ADMITTING PHYSICIAN: Monica Foreman, MD  DATE OF DISCHARGE: 04/20/2018 10:15 AM  PRIMARY CARE PHYSICIAN: Monica Ang, MD (Inactive)   ADMISSION DIAGNOSIS:  Thrombocytopenia ([place]) [D69.6] RUQ pain [R10.11] DISCHARGE DIAGNOSIS:  Active Problems:   Thrombocytopenia ([place])  SECONDARY DIAGNOSIS:   Past Medical History:  Diagnosis Date  . Anemia   . Complication of anesthesia    elevated blood pressure  . Difficult intubation   . Headache    HOSPITAL COURSE:  This is a 48 year old female admitted for thrombocytopenia.  Dengue fever Symptomatic treatment.  PT PTT is unremarkable.  Peripheral blood smear is unremarkable per Dr. Ramon Obrien. No fever or chills, no active bleeding.  1. 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 status post dexamethasone IV 20 mg;and also 1 unit of platelet transfusion.Dr. Joie Obrien mg of p.o. dexamethasone;and then p.o. 40 mg dexamethasone daily x4 days.  Dexamethasone is discontinued.  Platelet increased to 88,000. Follow-up Dr. Rogue Obrien as outpatient.  2.Acalculous cholecystitis withTransaminitis: Possible due to dengue fever and no indication for surgery per Dr. Dahlia Obrien.  3. History of anemia: Supposedly iron deficiency.Hemoglobin is normal. 4. DVT prophylaxis: SCDs.  I discussed with Dr. Steva Obrien.  DISCHARGE CONDITIONS:  Stable, discharged to home today. CONSULTS OBTAINED:  Treatment Team:  Monica Husbands, MD Monica Billing, MD DRUG ALLERGIES:   Allergies  Allergen Reactions  . Imitrex [Sumatriptan] Swelling   DISCHARGE MEDICATIONS:   Allergies as of 04/20/2018      Reactions   Imitrex [sumatriptan] Swelling      Medication List    You have not been prescribed  any medications.      DISCHARGE INSTRUCTIONS:  See AVS. If you experience worsening of your admission symptoms, develop shortness of breath, life threatening emergency, suicidal or homicidal thoughts you must seek medical attention immediately by calling 911 or calling your MD immediately  if symptoms less severe.  You Must read complete instructions/literature along with all the possible adverse reactions/side effects for all the Medicines you take and that have been prescribed to you. Take any new Medicines after you have completely understood and accpet all the possible adverse reactions/side effects.   Please note  You were cared for by a hospitalist during your hospital stay. If you have any questions about your discharge medications or the care you received while you were in the hospital after you are discharged, you can call the unit and asked to speak with the hospitalist on call if the hospitalist that took care of you is not available. Once you are discharged, your primary care physician will handle any further medical issues. Please note that NO REFILLS for any discharge medications will be authorized once you are discharged, as it is imperative that you return to your primary care physician (or establish a relationship with a primary care physician if you do not have one) for your aftercare needs so that they can reassess your need for medications and monitor your lab values.    On the day of Discharge:  VITAL SIGNS:  Blood pressure 105/70, pulse 65, temperature 97.9 F (36.6 C), temperature source Oral, resp. rate 16, height 5' (1.524 m), weight 69.7 kg, last menstrual period 06/08/2015, SpO2 99 %. PHYSICAL EXAMINATION:  GENERAL:  48 y.o.-year-old patient lying in the bed with  no acute distress.  EYES: Pupils equal, round, reactive to light and accommodation. No scleral icterus. Extraocular muscles intact.  HEENT: Head atraumatic, normocephalic. Oropharynx and nasopharynx clear.    NECK:  Supple, no jugular venous distention. No thyroid enlargement, no tenderness.  LUNGS: Normal breath sounds bilaterally, no wheezing, rales,rhonchi or crepitation. No use of accessory muscles of respiration.  CARDIOVASCULAR: S1, S2 normal. No murmurs, rubs, or gallops.  ABDOMEN: Soft, non-tender, non-distended. Bowel sounds present. No organomegaly or mass.  EXTREMITIES: No pedal edema, cyanosis, or clubbing.  NEUROLOGIC: Cranial nerves II through XII are intact. Muscle strength 5/5 in all extremities. Sensation intact. Gait not checked.  PSYCHIATRIC: The patient is alert and oriented x 3.  SKIN: No obvious rash, lesion, or ulcer.  DATA REVIEW:   CBC Recent Labs  Lab 04/20/18 0455  WBC 9.6  HGB 12.4  HCT 38.4  PLT 88*    Chemistries  Recent Labs  Lab 04/19/18 0428  NA 137  K 3.9  CL 104  CO2 23  GLUCOSE 165*  BUN 9  CREATININE 0.41*  CALCIUM 9.3  AST 210*  ALT 134*  ALKPHOS 114  BILITOT 0.6     Microbiology Results  Results for orders placed or performed during the hospital encounter of 01/06/17  Urine Culture     Status: None   Collection Time: 01/06/17 10:25 AM  Result Value Ref Range Status   Specimen Description URINE, RANDOM  Final   Special Requests NONE  Final   Culture   Final    NO GROWTH Performed at [facility], [place] [address], [place], [place] [address]    Report Status 01/08/2017 FINAL  Final    RADIOLOGY:  No results found.   Management plans discussed with the patient, family and they are in agreement.  CODE STATUS: Full Code   TOTAL TIME TAKING CARE OF THIS PATIENT: 33 minutes.    Monica Obrien M.D on 04/20/2018 at 1:16 PM  Between 7am to 6pm - Pager - [phone]  After 6pm go to [email or web address] - Proofreader  [facility]  Office  [phone]  CC: Primary care physician; Monica Ang, MD (Inactive)   Note: This dictation was prepared with Dragon dictation along with smaller  phrase technology. Any transcriptional errors that result from this process are unintentional.

## 2018-04-21 LAB — PARASITE EXAM, BLOOD

## 2018-04-21 LAB — PATHOLOGIST SMEAR REVIEW

## 2018-04-22 ENCOUNTER — Telehealth: Payer: Self-pay | Admitting: *Deleted

## 2018-04-22 DIAGNOSIS — D696 Thrombocytopenia, unspecified: Secondary | ICD-10-CM

## 2018-04-22 LAB — HEPATITIS PANEL, ACUTE
HCV Ab: <0.1 s/co ratio (ref 0.0–0.9)
HEP B C IGM: NEGATIVE
Hep A IgM: NEGATIVE
Hepatitis B Surface Ag: NEGATIVE

## 2018-04-22 LAB — HIV ANTIBODY (ROUTINE TESTING W REFLEX): HIV Screen 4th Generation wRfx: NONREACTIVE

## 2018-04-22 NOTE — Telephone Encounter (Signed)
Verbal msg from Dr. Rogue Bussing- see patient in 1 week with cbc, metc.

## 2018-04-30 ENCOUNTER — Inpatient Hospital Stay: Payer: BLUE CROSS/BLUE SHIELD | Admitting: Internal Medicine

## 2018-04-30 NOTE — Progress Notes (Unsigned)
[place] NOTE  Patient Care Team: Halfon, Guerry Bruin, MD (Inactive) as PCP - General (Obstetrics and Gynecology)  CHIEF COMPLAINTS/PURPOSE OF CONSULTATION:  ***  #   No history exists.     HISTORY OF PRESENTING ILLNESS:  Monica Obrien 48 y.o.  female    ROS   MEDICAL HISTORY:  Past Medical History:  Diagnosis Date  . Anemia   . Complication of anesthesia    elevated blood pressure  . Difficult intubation   . Headache     SURGICAL HISTORY: Past Surgical History:  Procedure Laterality Date  . BILATERAL SALPINGECTOMY Bilateral 08/26/2015   Procedure: BILATERAL SALPINGECTOMY;  Surgeon: Lorette Ang, MD;  Location: [facility] ORS;  Service: Gynecology;  Laterality: Bilateral;  . CYSTOSCOPY  08/26/2015   Procedure: CYSTOSCOPY;  Surgeon: Lorette Ang, MD;  Location: [facility] ORS;  Service: Gynecology;;  . LAPAROSCOPIC HYSTERECTOMY N/A 08/26/2015   Procedure: HYSTERECTOMY TOTAL LAPAROSCOPIC;  Surgeon: Lorette Ang, MD;  Location: [facility] ORS;  Service: Gynecology;  Laterality: N/A;  . TUBAL LIGATION      SOCIAL HISTORY: Social History   Socioeconomic History  . Marital status: Divorced    Spouse name: Not on file  . Number of children: Not on file  . Years of education: Not on file  . Highest education level: Not on file  Occupational History  . Not on file  Social Needs  . Financial resource strain: Not on file  . Food insecurity:    Worry: Not on file    Inability: Not on file  . Transportation needs:    Medical: Not on file    Non-medical: Not on file  Tobacco Use  . Smoking status: Never Smoker  . Smokeless tobacco: Never Used  Substance and Sexual Activity  . Alcohol use: No  . Drug use: No  . Sexual activity: Not on file  Lifestyle  . Physical activity:    Days per week: Not on file    Minutes per session: Not on file  . Stress: Not on file  Relationships  . Social connections:    Talks on phone: Not on file     Gets together: Not on file    Attends religious service: Not on file    Active member of club or organization: Not on file    Attends meetings of clubs or organizations: Not on file    Relationship status: Not on file  . Intimate partner violence:    Fear of current or ex partner: Not on file    Emotionally abused: Not on file    Physically abused: Not on file    Forced sexual activity: Not on file  Other Topics Concern  . Not on file  Social History Narrative  . Not on file    FAMILY HISTORY: Family History  Problem Relation Age of Onset  . Osteoporosis Mother     ALLERGIES:  is allergic to imitrex [sumatriptan].  MEDICATIONS:  No current outpatient medications on file.   No current facility-administered medications for this visit.       Marland Kitchen  PHYSICAL EXAMINATION: ECOG PERFORMANCE STATUS: {CHL ONC ECOG PS:[phone]}  There were no vitals filed for this visit. There were no vitals filed for this visit.  Physical Exam   LABORATORY DATA:  I have reviewed the data as listed Lab Results  Component Value Date   WBC 9.6 04/20/2018   HGB 12.4 04/20/2018   HCT 38.4 04/20/2018   MCV 88.1  04/20/2018   PLT 88 (L) 04/20/2018   Recent Labs    04/18/18 0014 04/19/18 0428  NA 134* 137  K 3.7 3.9  CL 100 104  CO2 26 23  GLUCOSE 104* 165*  BUN 6 9  CREATININE 0.45 0.41*  CALCIUM 8.5* 9.3  GFRNONAA >60 >60  GFRAA >60 >60  PROT 7.0 7.7  ALBUMIN 3.4* 3.7  AST 197* 210*  ALT 104* 134*  ALKPHOS 108 114  BILITOT 0.7 0.6    RADIOGRAPHIC STUDIES: I have personally reviewed the radiological images as listed and agreed with the findings in the report. Ct Head Wo Contrast  Result Date: 04/18/2018 CLINICAL DATA:  Cough, fatigue, upper abdominal pain EXAM: CT HEAD WITHOUT CONTRAST TECHNIQUE: Contiguous axial images were obtained from the base of the skull through the vertex without intravenous contrast. COMPARISON:  MRI 08/31/2015 FINDINGS: Brain: No evidence of acute  infarction, hemorrhage, hydrocephalus, extra-axial collection or mass lesion/mass effect. Vascular: Hyperdense vessels and venous sinuses due to previous administration of intravenous contrast. This limits the study. Skull: Normal. Negative for fracture or focal lesion. Sinuses/Orbits: No acute finding. Other: None IMPRESSION: 1. Slightly limited exam secondary to recent administration of intravenous contrast 2. No definite CT evidence for acute intracranial abnormality. Electronically Signed   By: Donavan Foil M.D.   On: 04/18/2018 03:07   Ct Abdomen Pelvis W Contrast  Result Date: 04/18/2018 CLINICAL DATA:  Abdomen pain EXAM: CT ABDOMEN AND PELVIS WITH CONTRAST TECHNIQUE: Multidetector CT imaging of the abdomen and pelvis was performed using the standard protocol following bolus administration of intravenous contrast. CONTRAST:  162mL ISOVUE-300 IOPAMIDOL (ISOVUE-300) INJECTION 61% COMPARISON:  CT 01/06/2017 FINDINGS: Lower chest: Lung bases demonstrate no acute consolidation or effusion. The heart size is normal. Stable 3 mm right lower lobe subpleural pulmonary nodule. Hepatobiliary: Stable subcentimeter left hepatic lobe lesion, likely a small cyst. Prominent gallbladder wall thickening is present. No biliary dilatation Pancreas: Unremarkable. No pancreatic ductal dilatation or surrounding inflammatory changes. Spleen: Normal in size without focal abnormality. Adrenals/Urinary Tract: Adrenal glands are unremarkable. Kidneys are normal, without renal calculi, focal lesion, or hydronephrosis. Bladder is unremarkable. Stomach/Bowel: The stomach is within normal limits. No dilated small bowel. No colon wall thickening. Negative appendix. Vascular/Lymphatic: Nonaneurysmal aorta. No significantly enlarged lymph nodes. Reproductive: Status post hysterectomy. Stable 18 mm left adnexal cyst. Other: Negative for free air or free fluid. Musculoskeletal: No acute or significant osseous findings. IMPRESSION: 1.  Prominent gallbladder wall thickening for which ultrasound correlation is recommended. 2. Stable peripheral right lower lobe 3 mm lung nodule, felt benign given lack of interval change. Electronically Signed   By: Donavan Foil M.D.   On: 04/18/2018 03:01   US Abdomen Limited Ruq  Result Date: 04/18/2018 CLINICAL DATA:  Right upper quadrant pain EXAM: ULTRASOUND ABDOMEN LIMITED RIGHT UPPER QUADRANT COMPARISON:  None. FINDINGS: Gallbladder: No gallstones are evident. The wall of the gallbladder is thickened edematous. There is equivocal pericholecystic fluid. No sonographic Murphy sign noted by sonographer. Common bile duct: Diameter: 4 mm. No intrahepatic or extrahepatic biliary duct dilatation. Liver: No focal lesion identified. Liver echogenicity is increased. Portal vein is patent on color Doppler imaging with normal direction of blood flow towards the liver. IMPRESSION: 1. Gallbladder wall is thickened and edematous with subtle pericholecystic fluid. No gallstones seen. The appearance is concerning for acalculus cholecystitis. 2. Diffuse increase in liver echogenicity, a finding indicative of hepatic steatosis. Electronically Signed   By: Lowella Grip III M.D.   On: 04/18/2018  08:08    ASSESSMENT & PLAN:   No problem-specific Assessment & Plan notes found for this encounter.    All questions were answered. The patient knows to call the clinic with any problems, questions or concerns.       Cammie Sickle, MD 04/30/2018 8:18 AM

## 2018-04-30 NOTE — Assessment & Plan Note (Signed)
#  48 year old Hispanic female patient currently admitted to hospital for nausea vomiting abdominal discomfort-noted to have severe thrombocytopenia platelets 9-11.  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  Patient currently status post dexamethasone IV 20 mg; and also 1 unit of platelet transfusion.  Recommend 20 mg of p.o. dexamethasone today; and then p.o. 40 mg dexamethasone daily x4 days.  #Elevated LFTs-AST ALT without bilirubin.-Question viral infection.  Monitor closely.  #Nausea vomiting abdominal discomfort/myalgias arthralgias-question viral infection vs-others given international travel.

## 2018-05-16 LAB — MISCELLANEOUS TEST

## 2018-06-09 DIAGNOSIS — R3915 Urgency of urination: Secondary | ICD-10-CM | POA: Insufficient documentation

## 2019-03-24 ENCOUNTER — Other Ambulatory Visit: Payer: Self-pay | Admitting: Obstetrics and Gynecology

## 2019-03-24 DIAGNOSIS — Z1231 Encounter for screening mammogram for malignant neoplasm of breast: Secondary | ICD-10-CM

## 2019-03-27 ENCOUNTER — Ambulatory Visit
Admission: RE | Admit: 2019-03-27 | Discharge: 2019-03-27 | Disposition: A | Discharge status: Home / Self Care | Payer: BC Managed Care – PPO | Source: Ambulatory Visit | Attending: Obstetrics and Gynecology | Admitting: Obstetrics and Gynecology

## 2019-03-27 DIAGNOSIS — Z1231 Encounter for screening mammogram for malignant neoplasm of breast: Secondary | ICD-10-CM

## 2020-03-04 IMAGING — CT CT HEAD W/O CM
3 series · 15 of 44 positions shown, 18 images · non-contrast
Comparison: MRI 08/31/2015

CLINICAL DATA: Cough, fatigue, upper abdominal pain

EXAM:
CT HEAD WITHOUT CONTRAST
TECHNIQUE: Contiguous axial images were obtained from the base of the skull
through the vertex without intravenous contrast.

[Series 2: head wo · axial · 0.36mm/px · z∈[-122,-12]mm · 9 of 27 slices shown, 12 images]
[im 3/27  brain]
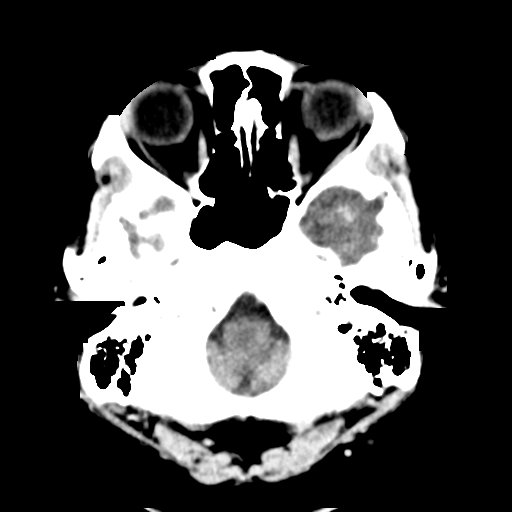
[im 3/27  bone]
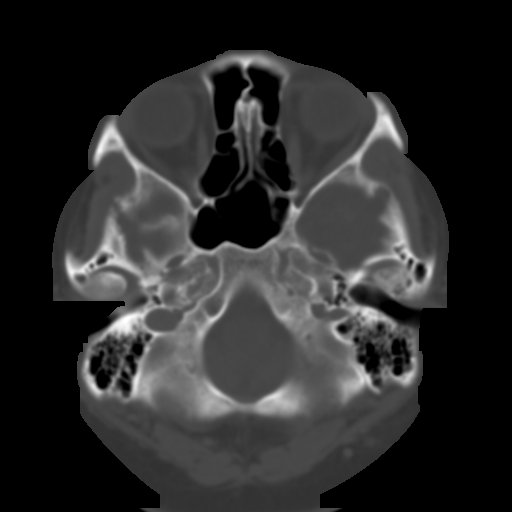
[im 6/27  brain]
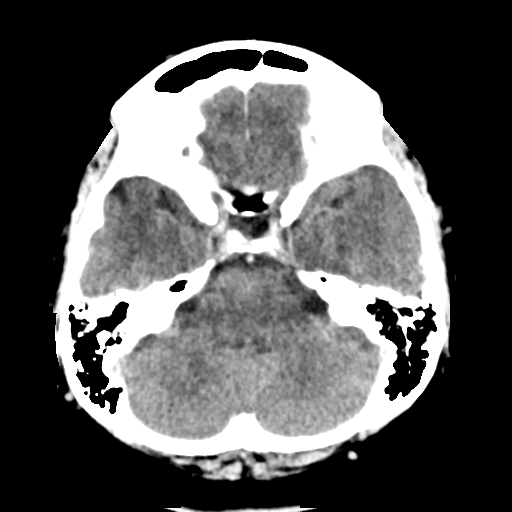
[im 8/27  brain]
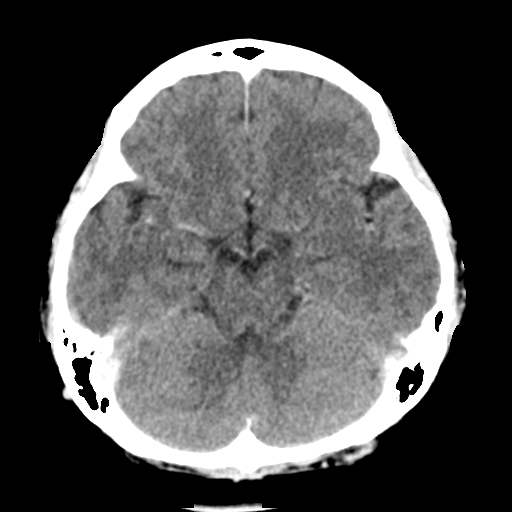
[im 11/27  brain]
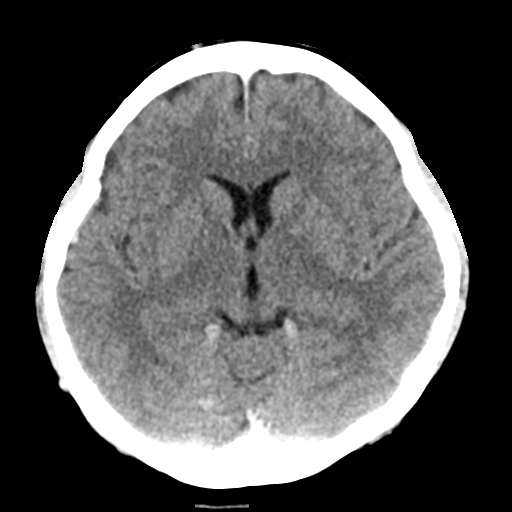
[im 14/27  brain]
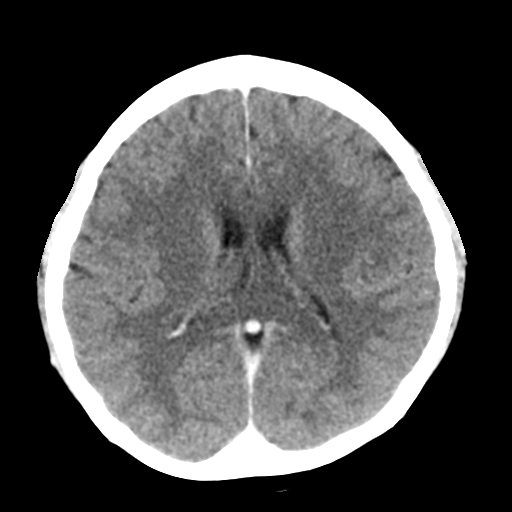
[im 14/27  bone]
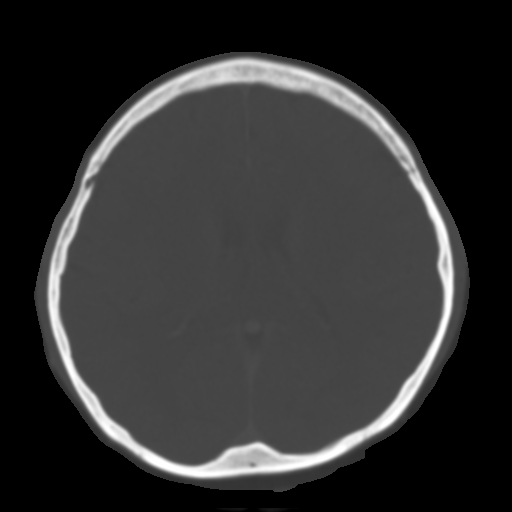
[im 17/27  brain]
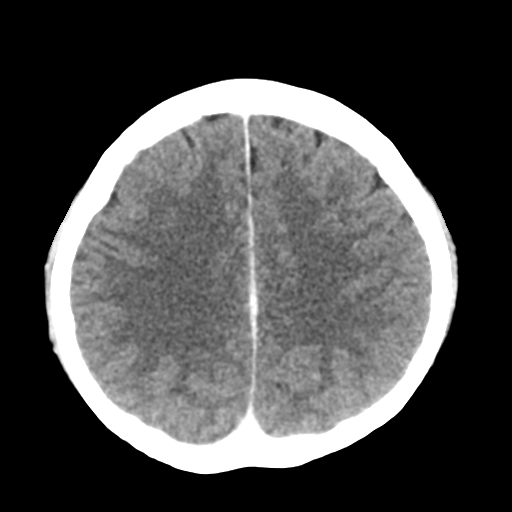
[im 20/27  brain]
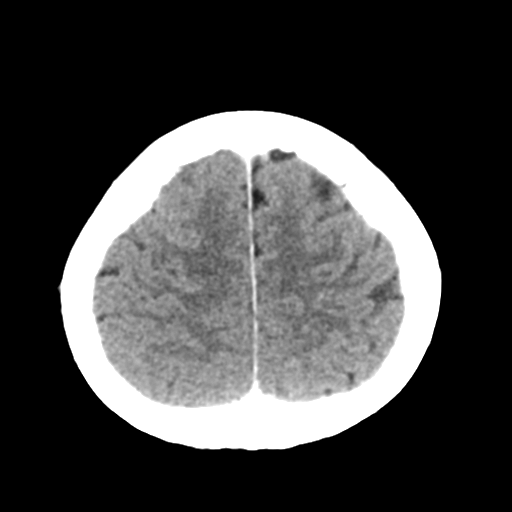
[im 22/27  brain]
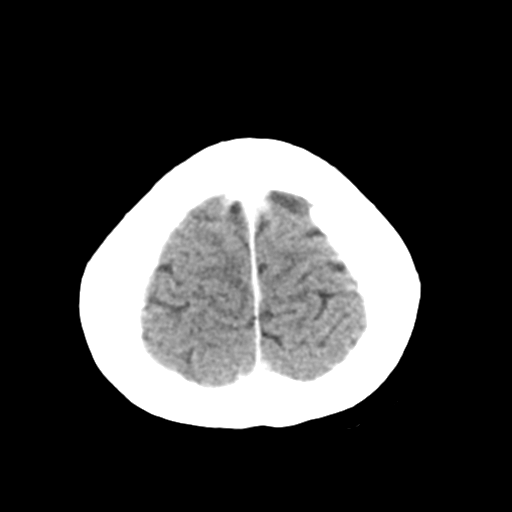
[im 25/27  brain]
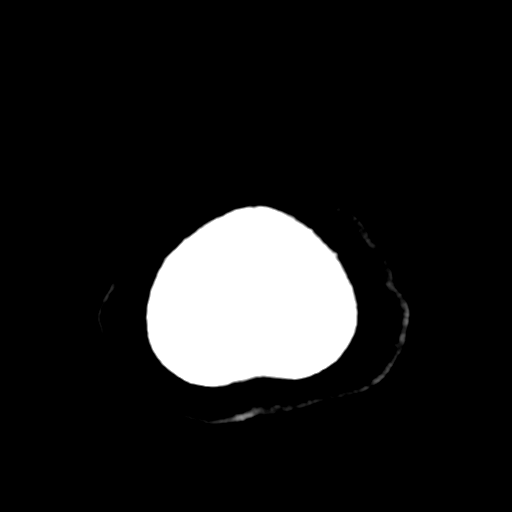
[im 25/27  bone]
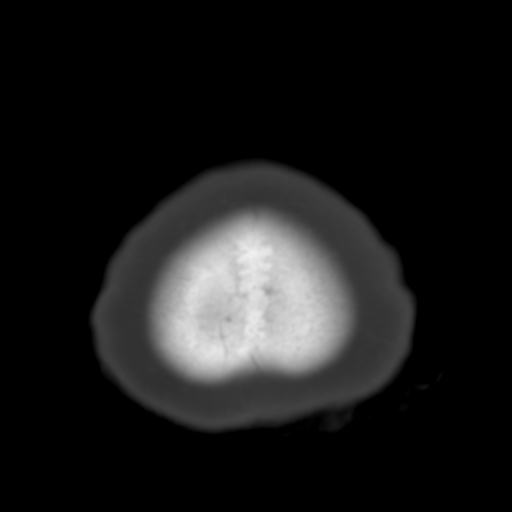

[Series 4: coronal soft tissue · coronal · 0.27mm/px · 3 of 55 slices shown]
[im 19/55  brain]
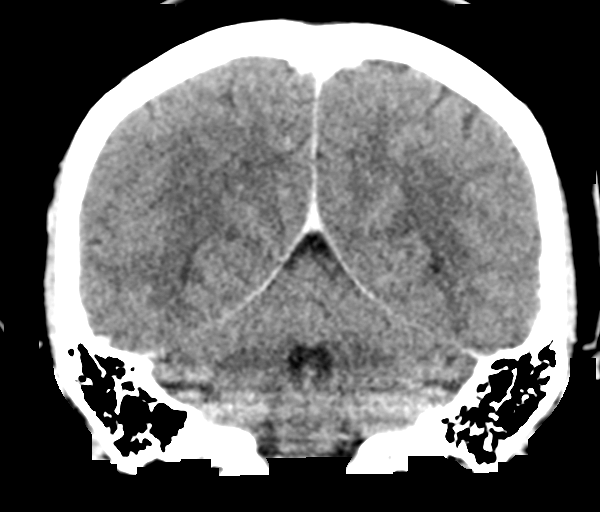
[im 25/55  brain]
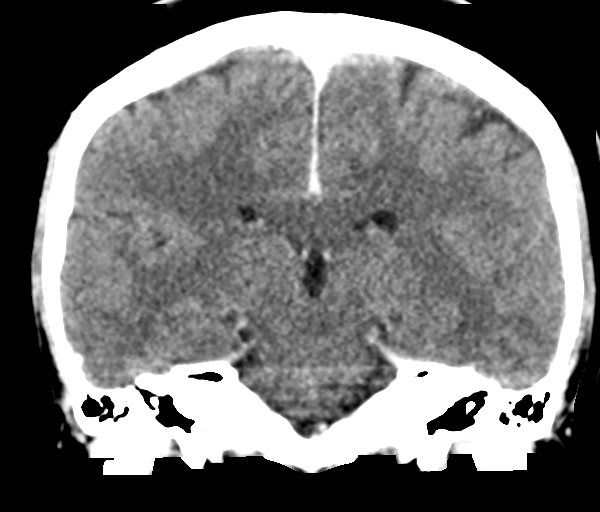
[im 31/55  brain]
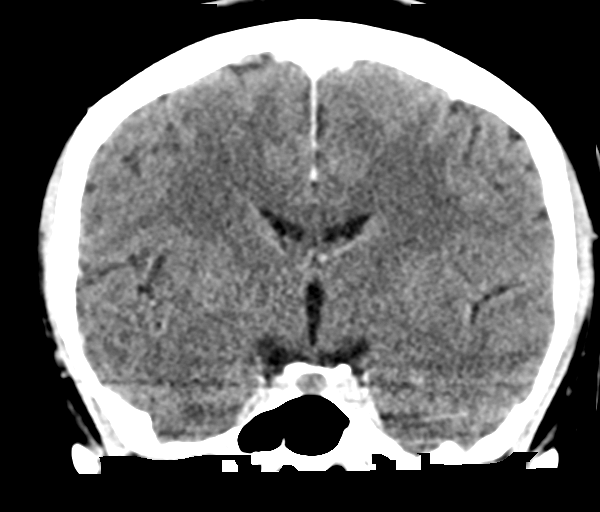

[Series 5: sagittal soft tissue · sagittal · 0.27mm/px · 3 of 53 slices shown]
[im 18/53  brain]
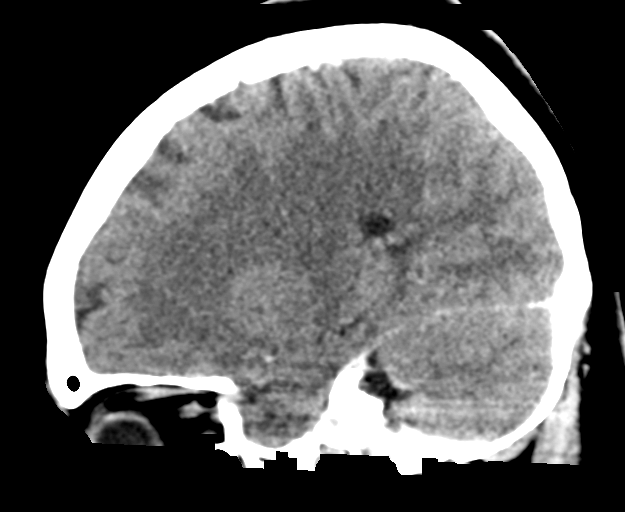
[im 27/53  brain]
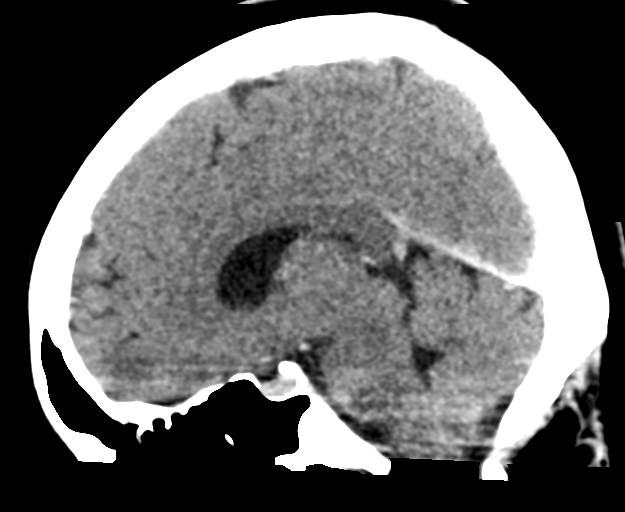
[im 35/53  brain]
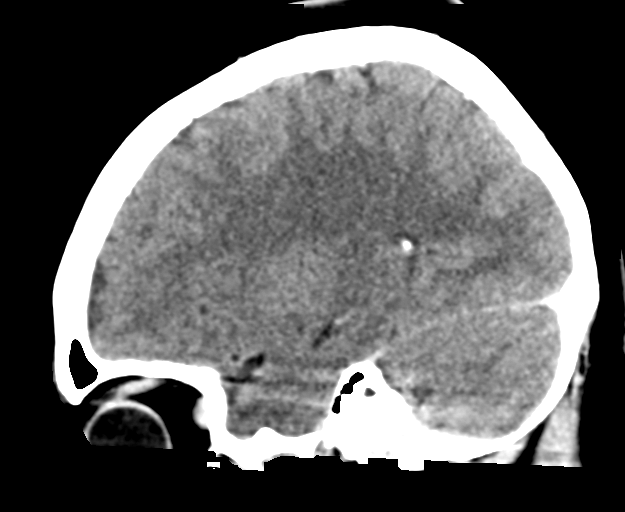

[15 of 44 positions shown; findings below may reference images not displayed]

FINDINGS: Brain: No evidence of acute infarction, hemorrhage, hydrocephalus,
extra-axial collection or mass lesion/mass effect.

Vascular: Hyperdense vessels and venous sinuses due to previous
administration of intravenous contrast. This limits the study.

Skull: Normal. Negative for fracture or focal lesion.

Sinuses/Orbits: No acute finding.

Other: None
IMPRESSION: 1. Slightly limited exam secondary to recent administration of
intravenous contrast
2. No definite CT evidence for acute intracranial abnormality.

## 2020-05-26 ENCOUNTER — Other Ambulatory Visit: Payer: Self-pay | Admitting: Family Medicine

## 2020-05-26 DIAGNOSIS — Z1231 Encounter for screening mammogram for malignant neoplasm of breast: Secondary | ICD-10-CM

## 2020-06-17 ENCOUNTER — Ambulatory Visit
Admission: RE | Admit: 2020-06-17 | Discharge: 2020-06-17 | Disposition: A | Discharge status: Home / Self Care | Payer: BC Managed Care – PPO | Source: Ambulatory Visit | Attending: Family Medicine | Admitting: Family Medicine

## 2020-06-17 ENCOUNTER — Other Ambulatory Visit: Payer: Self-pay

## 2020-06-17 DIAGNOSIS — Z1231 Encounter for screening mammogram for malignant neoplasm of breast: Secondary | ICD-10-CM | POA: Insufficient documentation

## 2020-07-04 ENCOUNTER — Other Ambulatory Visit: Payer: Self-pay | Admitting: Family Medicine

## 2020-07-04 DIAGNOSIS — R928 Other abnormal and inconclusive findings on diagnostic imaging of breast: Secondary | ICD-10-CM

## 2020-07-04 DIAGNOSIS — N6310 Unspecified lump in the right breast, unspecified quadrant: Secondary | ICD-10-CM

## 2020-07-11 ENCOUNTER — Ambulatory Visit
Admission: RE | Admit: 2020-07-11 | Discharge: 2020-07-11 | Disposition: A | Discharge status: Home / Self Care | Payer: BC Managed Care – PPO | Source: Ambulatory Visit | Attending: Family Medicine | Admitting: Family Medicine

## 2020-07-11 ENCOUNTER — Other Ambulatory Visit: Payer: Self-pay

## 2020-07-11 DIAGNOSIS — N6310 Unspecified lump in the right breast, unspecified quadrant: Secondary | ICD-10-CM | POA: Insufficient documentation

## 2020-07-11 DIAGNOSIS — R928 Other abnormal and inconclusive findings on diagnostic imaging of breast: Secondary | ICD-10-CM | POA: Diagnosis not present

## 2020-07-15 ENCOUNTER — Other Ambulatory Visit: Payer: Self-pay | Admitting: Family Medicine

## 2020-07-15 DIAGNOSIS — N6001 Solitary cyst of right breast: Secondary | ICD-10-CM

## 2020-09-16 ENCOUNTER — Emergency Department
Admission: EM | Admit: 2020-09-16 | Discharge: 2020-09-16 | Disposition: A | Discharge status: Home / Self Care | Payer: BC Managed Care – PPO | Attending: Emergency Medicine | Admitting: Emergency Medicine

## 2020-09-16 ENCOUNTER — Emergency Department: Payer: BC Managed Care – PPO

## 2020-09-16 ENCOUNTER — Other Ambulatory Visit: Payer: Self-pay

## 2020-09-16 DIAGNOSIS — Z20822 Contact with and (suspected) exposure to covid-19: Secondary | ICD-10-CM | POA: Diagnosis not present

## 2020-09-16 DIAGNOSIS — R42 Dizziness and giddiness: Secondary | ICD-10-CM | POA: Insufficient documentation

## 2020-09-16 DIAGNOSIS — R112 Nausea with vomiting, unspecified: Secondary | ICD-10-CM | POA: Insufficient documentation

## 2020-09-16 LAB — COMPREHENSIVE METABOLIC PANEL
ALT: 19 U/L (ref 0–44)
AST: 24 U/L (ref 15–41)
Albumin: 4.2 g/dL (ref 3.5–5.0)
Alkaline Phosphatase: 81 U/L (ref 38–126)
Anion gap: 9 (ref 5–15)
BUN: 11 mg/dL (ref 6–20)
CO2: 23 mmol/L (ref 22–32)
Calcium: 9.2 mg/dL (ref 8.9–10.3)
Chloride: 103 mmol/L (ref 98–111)
Creatinine, Ser: 0.4 mg/dL — ABNORMAL LOW (ref 0.44–1.00)
GFR, Estimated: >60 mL/min (ref >60)
Glucose, Bld: 143 mg/dL — ABNORMAL HIGH (ref 70–99)
Potassium: 3.5 mmol/L (ref 3.5–5.1)
Sodium: 135 mmol/L (ref 135–145)
Total Bilirubin: 0.6 mg/dL (ref 0.3–1.2)
Total Protein: 7.4 g/dL (ref 6.5–8.1)

## 2020-09-16 LAB — RESP PANEL BY RT-PCR (FLU A&B, COVID) ARPGX2
Influenza A by PCR: NEGATIVE
Influenza B by PCR: NEGATIVE
SARS Coronavirus 2 by RT PCR: NEGATIVE

## 2020-09-16 LAB — CBC
HCT: 39.6 % (ref 36.0–46.0)
Hemoglobin: 13.6 g/dL (ref 12.0–15.0)
MCH: 29.5 pg (ref 26.0–34.0)
MCHC: 34.3 g/dL (ref 30.0–36.0)
MCV: 85.9 fL (ref 80.0–100.0)
Platelets: 314 10*3/uL (ref 150–400)
RBC: 4.61 MIL/uL (ref 3.87–5.11)
RDW: 13.1 % (ref 11.5–15.5)
WBC: 9.7 10*3/uL (ref 4.0–10.5)
nRBC: 0 % (ref 0.0–0.2)

## 2020-09-16 LAB — TROPONIN I (HIGH SENSITIVITY)
Troponin I (High Sensitivity): <2 ng/L (ref <18)
Troponin I (High Sensitivity): <2 ng/L (ref <18)

## 2020-09-16 MED ORDER — ONDANSETRON 4 MG PO TBDP
4.0000 mg | ORAL_TABLET | Freq: Once | ORAL | Status: AC
  Administered 2020-09-16: 4 mg via ORAL
  Filled 2020-09-16: qty 1

## 2020-09-16 MED ORDER — MECLIZINE HCL 25 MG PO TABS
25.0000 mg | ORAL_TABLET | Freq: Once | ORAL | Status: AC
  Administered 2020-09-16: 25 mg via ORAL
  Filled 2020-09-16: qty 1

## 2020-09-16 MED ORDER — ONDANSETRON HCL 4 MG PO TABS: 4.0000 mg | ORAL_TABLET | Freq: Three times a day (TID) | ORAL | 0 refills | Status: DC | PRN

## 2020-09-16 MED ORDER — MECLIZINE HCL 25 MG PO TABS: 25.0000 mg | ORAL_TABLET | Freq: Two times a day (BID) | ORAL | 0 refills | Status: AC | PRN

## 2020-09-16 NOTE — ED Notes (Addendum)
MD Charlann Boxer made aware of pt concern due to another episode of dizziness while this RN was not in the room. Pt stated that she closed her eyes due to the room spinning.  See new orders

## 2020-09-16 NOTE — ED Triage Notes (Signed)
Pt in with co dizziness that started tonight.. States has had vomiting and shob since. No diarrhea, denies any co pain.

## 2020-09-16 NOTE — ED Notes (Signed)
Pt given cup of water and encouraged to drink per MD verbal order at this time.

## 2020-09-16 NOTE — ED Notes (Signed)
Pt verbalized understanding of d/c instructions at this time. Prescriptions and follow-up care reviewed at this time. Pt denies further questions. Pt ambulatory with daughter to ED lobby, NAD noted RR even and unlabored at this time.

## 2020-09-16 NOTE — ED Provider Notes (Signed)
[facility] Emergency Department Provider Note  ____________________________________________   Event Date/Time   First MD Initiated Contact with Patient 09/16/20 [phone]     (approximate)  I have reviewed the triage vital signs and the nursing notes.   HISTORY  Chief Complaint Vomiting and Dizziness   HPI Monica Obrien is a 50 y.o. female with a past medical history of headache and anemia and thrombocytopenia due to remote finger infection who presents for assessment of some dizziness that started last night associate with some nonbloody nonbilious vomiting.  She denies any headache, vision changes, chest pain, cough, shortness of breath, abdominal pain, acute back pain, urinary symptoms, constipation, rash or extremity pain.  No focal weakness numbness or tingling.  No prior similar episodes.  No clear alleviating or aggravating factors.  States he is not currently taking any medicines.  Denies EtOH illicit drug use or tobacco abuse.  She does endorse some achiness in her joints.         Past Medical History:  Diagnosis Date   Anemia    Complication of anesthesia    elevated blood pressure   Difficult intubation    Headache     Patient Active Problem List   Diagnosis Date Noted   Thrombocytopenia ([place]) 04/18/2018   S/P laparoscopic hysterectomy 08/26/2015    Past Surgical History:  Procedure Laterality Date   BILATERAL SALPINGECTOMY Bilateral 08/26/2015   Procedure: BILATERAL SALPINGECTOMY;  Surgeon: Lorette Ang, MD;  Location: [facility] ORS;  Service: Gynecology;  Laterality: Bilateral;   CYSTOSCOPY  08/26/2015   Procedure: CYSTOSCOPY;  Surgeon: Lorette Ang, MD;  Location: [facility] ORS;  Service: Gynecology;;   LAPAROSCOPIC HYSTERECTOMY N/A 08/26/2015   Procedure: HYSTERECTOMY TOTAL LAPAROSCOPIC;  Surgeon: Lorette Ang, MD;  Location: [facility] ORS;  Service: Gynecology;  Laterality: N/A;   TUBAL LIGATION      Prior to Admission  medications   Medication Sig Start Date End Date Taking? Authorizing Provider  meclizine (ANTIVERT) 25 MG tablet Take 1 tablet (25 mg total) by mouth 2 (two) times daily as needed for up to 3 days for dizziness. 09/16/20 09/19/20 Yes Lucrezia Starch, MD  ondansetron (ZOFRAN) 4 MG tablet Take 1 tablet (4 mg total) by mouth every 8 (eight) hours as needed for up to 10 doses for nausea or vomiting. 09/16/20  Yes Lucrezia Starch, MD    Allergies Imitrex [sumatriptan]  Family History  Problem Relation Age of Onset   Osteoporosis Mother    Breast cancer Neg Hx     Social History Social History   Tobacco Use   Smoking status: Never   Smokeless tobacco: Never  Substance Use Topics   Alcohol use: No   Drug use: No    Review of Systems  Review of Systems  Constitutional:  Negative for chills and fever.  HENT:  Negative for sore throat.   Eyes:  Negative for pain.  Respiratory:  Negative for cough and stridor.   Cardiovascular:  Negative for chest pain.  Gastrointestinal:  Positive for nausea and vomiting. Negative for abdominal pain.  Genitourinary:  Negative for dysuria.  Musculoskeletal:  Positive for myalgias.  Skin:  Negative for rash.  Neurological:  Positive for dizziness. Negative for seizures, loss of consciousness and headaches.  Psychiatric/Behavioral:  Negative for suicidal ideas.   All other systems reviewed and are negative.    ____________________________________________   PHYSICAL EXAM:  VITAL SIGNS: ED Triage Vitals [09/16/20 0635]  Enc Vitals Group  BP (!) 147/90     Pulse Rate 88     Resp 20     Temp 98.3 F (36.8 C)     Temp Source Oral     SpO2 99 %     Weight 145 lb (65.8 kg)     Height      Head Circumference      Peak Flow      Pain Score 0     Pain Loc      Pain Edu?      Excl. in [place]?    Vitals:   09/16/20 0635 09/16/20 0938  BP: (!) 147/90 133/87  Pulse: 88 74  Resp: 20 20  Temp: 98.3 F (36.8 C)   SpO2: 99% 99%   Physical  Exam Vitals and nursing note reviewed.  Constitutional:      General: She is not in acute distress.    Appearance: She is well-developed.  HENT:     Head: Normocephalic and atraumatic.     Right Ear: Tympanic membrane and external ear normal.     Left Ear: External ear normal.     Nose: Nose normal.  Eyes:     Conjunctiva/sclera: Conjunctivae normal.  Cardiovascular:     Rate and Rhythm: Normal rate and regular rhythm.     Heart sounds: No murmur heard. Pulmonary:     Effort: Pulmonary effort is normal. No respiratory distress.     Breath sounds: Normal breath sounds.  Abdominal:     Palpations: Abdomen is soft.     Tenderness: There is no abdominal tenderness.  Musculoskeletal:     Cervical back: Neck supple.  Skin:    General: Skin is warm and dry.  Neurological:     Mental Status: She is alert and oriented to person, place, and time.    Cranial nerves II through XII grossly intact.  No pronator drift.  No finger dysmetria.  Symmetric 5/5 strength of all extremities.  Sensation intact to light touch in all extremities.  Unremarkable unassisted gait.  Oropharynx is unremarkable.  Head face and scalp is unremarkable.  Patient is not meningitic.  ____________________________________________   LABS (all labs ordered are listed, but only abnormal results are displayed)  Labs Reviewed  COMPREHENSIVE METABOLIC PANEL - Abnormal; Notable for the following components:      Result Value   Glucose, Bld 143 (*)    Creatinine, Ser 0.40 (*)    All other components within normal limits  RESP PANEL BY RT-PCR (FLU A&B, COVID) ARPGX2  CBC  TROPONIN I (HIGH SENSITIVITY)  TROPONIN I (HIGH SENSITIVITY)   ____________________________________________  EKG  Sinus rhythm with ventricular to 78, normal axis, unremarkable intervals without evidence of acute ischemia or significant underlying arrhythmia. ____________________________________________  RADIOLOGY  ED MD interpretation: No  focal consolidation, large effusion, significant edema, pneumothorax or any other clear acute intrathoracic process.  Official radiology report(s): DG Chest 2 View  Result Date: 09/16/2020 CLINICAL DATA:  Shortness of breath. Dizziness that started tonight. EXAM: CHEST - 2 VIEW COMPARISON:  None. FINDINGS: Normal heart size and mediastinal contours. No acute infiltrate or edema. No effusion or pneumothorax. No acute osseous findings. IMPRESSION: Negative chest Electronically Signed   By: Monte Fantasia M.D.   On: 09/16/2020 07:36    ____________________________________________   PROCEDURES  Procedure(s) performed (including Critical Care):  .1-3 Lead EKG Interpretation  Date/Time: 09/16/2020 10:49 AM Performed by: Lucrezia Starch, MD Authorized by: Lucrezia Starch, MD  Interpretation: normal     ECG rate assessment: normal     Rhythm: sinus rhythm     Ectopy: none     Conduction: normal     ____________________________________________   INITIAL IMPRESSION / ASSESSMENT AND PLAN / ED COURSE      Patient presents with above to history exam for assessment of some dizziness associate with nausea and vomiting that started last night.  On arrival she is afebrile hemodynamically stable.  She does not appear significantly dehydrated and his lungs are clear bilaterally.  She has a nonfocal neuro exam and is able to ambulate with steady gait.  No nystagmus.  Differential includes possible peripheral vertigo versus atypical presentation for ACS, infectious gastritis, metabolic derangements, arrhythmia, anemia, and CVA.  Overall very low suspicion for stroke as patient has no objective neurological deficits nystagmus able to ambulate with steady gait.  In addition she has no active vomiting in the emergency room.  No evidence on exam of deep space infection in the head or neck.  No history, exam features suggest recent trauma.  EKG and nonelevated troponins x2 are not suggestive of  ACS.  CBC shows no leukocytosis or acute anemia.  CMP shows no clear acute metabolic derangements.  COVID and influenza is negative.  Overall unclear allergy for symptoms although peripheral vertigo including BPPV is within differential versus arthritis versus some gastritis.  He does not significantly it is clearly triggered or clearly spontaneously she states sometimes she just has short bursts of vertigo when she closes her eyes but is not precipitated by any movements.  She was given some antiemetics and meclizine was able tolerate p.o.  Given stable vitals otherwise reassuring exam and work-up I think she is stable for outpatient follow-up.  Discharged stable condition.  Strict return cautions advised and discussed.        ____________________________________________   FINAL CLINICAL IMPRESSION(S) / ED DIAGNOSES  Final diagnoses:  Dizziness  Nausea and vomiting, intractability of vomiting not specified, unspecified vomiting type    Medications  ondansetron (ZOFRAN-ODT) disintegrating tablet 4 mg (4 mg Oral Given 09/16/20 0938)  meclizine (ANTIVERT) tablet 25 mg (25 mg Oral Given 09/16/20 1048)     ED Discharge Orders          Ordered    meclizine (ANTIVERT) 25 MG tablet  2 times daily PRN        09/16/20 1023    ondansetron (ZOFRAN) 4 MG tablet  Every 8 hours PRN        09/16/20 1023             Note:  This document was prepared using Dragon voice recognition software and may include unintentional dictation errors.    Lucrezia Starch, MD 09/16/20 1049

## 2021-01-16 ENCOUNTER — Ambulatory Visit
Admission: RE | Admit: 2021-01-16 | Discharge: 2021-01-16 | Disposition: A | Discharge status: Home / Self Care | Payer: BC Managed Care – PPO | Source: Ambulatory Visit | Attending: Family Medicine | Admitting: Family Medicine

## 2021-01-16 ENCOUNTER — Other Ambulatory Visit: Payer: Self-pay

## 2021-01-16 DIAGNOSIS — N6001 Solitary cyst of right breast: Secondary | ICD-10-CM | POA: Diagnosis not present

## 2022-01-02 ENCOUNTER — Other Ambulatory Visit: Payer: Self-pay | Admitting: Family Medicine

## 2022-01-02 DIAGNOSIS — N63 Unspecified lump in unspecified breast: Secondary | ICD-10-CM

## 2022-02-23 ENCOUNTER — Ambulatory Visit
Admission: RE | Admit: 2022-02-23 | Discharge: 2022-02-23 | Disposition: A | Discharge status: Home / Self Care | Payer: BC Managed Care – PPO | Source: Ambulatory Visit | Attending: Family Medicine | Admitting: Family Medicine

## 2022-02-23 DIAGNOSIS — N63 Unspecified lump in unspecified breast: Secondary | ICD-10-CM

## 2022-03-06 ENCOUNTER — Telehealth: Payer: Self-pay

## 2022-03-06 NOTE — Telephone Encounter (Signed)
Patient lvm yesterday to schedule her colonoscopy.  I returned her call this morning using the assistance of [employer].  Left voice message for her to call office back.  Thanks,  [place], [place]

## 2022-03-07 ENCOUNTER — Other Ambulatory Visit: Payer: Self-pay | Admitting: *Deleted

## 2022-03-07 ENCOUNTER — Telehealth: Payer: Self-pay | Admitting: *Deleted

## 2022-03-07 DIAGNOSIS — Z1211 Encounter for screening for malignant neoplasm of colon: Secondary | ICD-10-CM

## 2022-03-07 MED ORDER — NA SULFATE-K SULFATE-MG SULF 17.5-3.13-1.6 GM/177ML PO SOLN: 1.0000 | Freq: Once | ORAL | 0 refills | Status: AC

## 2022-03-07 NOTE — Telephone Encounter (Signed)
Gastroenterology Pre-Procedure Review  Request Date: 03/26/2022 Requesting Physician: Dr. Marius Ditch  PATIENT REVIEW QUESTIONS: The patient responded to the following health history questions as indicated:    1. Are you having any GI issues? no 2. Do you have a personal history of Polyps? no 3. Do you have a family history of Colon Cancer or Polyps? no 4. Diabetes Mellitus? no 5. Joint replacements in the past 12 months?no 6. Major health problems in the past 3 months?no 7. Any artificial heart valves, MVP, or defibrillator?no    MEDICATIONS & ALLERGIES:    Patient reports the following regarding taking any anticoagulation/antiplatelet therapy:   Plavix, Coumadin, Eliquis, Xarelto, Lovenox, Pradaxa, Brilinta, or Effient? no Aspirin? no  Patient confirms/reports the following medications:  Current Outpatient Medications  Medication Sig Dispense Refill   ondansetron (ZOFRAN) 4 MG tablet Take 1 tablet (4 mg total) by mouth every 8 (eight) hours as needed for up to 10 doses for nausea or vomiting. 10 tablet 0   No current facility-administered medications for this visit.    Patient confirms/reports the following allergies:  Allergies  Allergen Reactions   Imitrex [Sumatriptan] Swelling    No orders of the defined types were placed in this encounter.   AUTHORIZATION INFORMATION Primary Insurance: 1D#: Group #:  Secondary Insurance: 1D#: Group #:  SCHEDULE INFORMATION: Date: 03/26/2022 Time: Location: [facility]

## 2022-03-26 ENCOUNTER — Ambulatory Visit: Payer: BC Managed Care – PPO | Admitting: Registered Nurse

## 2022-03-26 ENCOUNTER — Encounter: Payer: Self-pay | Admitting: Gastroenterology

## 2022-03-26 ENCOUNTER — Encounter
Admission: RE | Disposition: A | Discharge status: Home / Self Care | Payer: Self-pay | Source: Home / Self Care | Attending: Gastroenterology

## 2022-03-26 ENCOUNTER — Ambulatory Visit
Admission: RE | Admit: 2022-03-26 | Discharge: 2022-03-26 | Disposition: A | Discharge status: Home / Self Care | Payer: BC Managed Care – PPO | Attending: Gastroenterology | Admitting: Gastroenterology

## 2022-03-26 DIAGNOSIS — Z1211 Encounter for screening for malignant neoplasm of colon: Secondary | ICD-10-CM

## 2022-03-26 DIAGNOSIS — K644 Residual hemorrhoidal skin tags: Secondary | ICD-10-CM | POA: Diagnosis not present

## 2022-03-26 HISTORY — PX: COLONOSCOPY WITH PROPOFOL: SHX5780

## 2022-03-26 SURGERY — COLONOSCOPY WITH PROPOFOL
Anesthesia: General

## 2022-03-26 MED ORDER — PROPOFOL 500 MG/50ML IV EMUL
INTRAVENOUS | Status: DC | PRN
  Administered 2022-03-26: 150 ug/kg/min via INTRAVENOUS

## 2022-03-26 MED ORDER — PROPOFOL 10 MG/ML IV BOLUS
INTRAVENOUS | Status: DC | PRN
  Administered 2022-03-26: 70 mg via INTRAVENOUS

## 2022-03-26 MED ORDER — SODIUM CHLORIDE 0.9 % IV SOLN
INTRAVENOUS | Status: DC
  Administered 2022-03-26: 1000 mL via INTRAVENOUS

## 2022-03-26 MED ORDER — LIDOCAINE HCL (CARDIAC) PF 100 MG/5ML IV SOSY
PREFILLED_SYRINGE | INTRAVENOUS | Status: DC | PRN
  Administered 2022-03-26: 40 mg via INTRAVENOUS

## 2022-03-26 MED ORDER — STERILE WATER FOR IRRIGATION IR SOLN
Status: DC | PRN
  Administered 2022-03-26: 60 mL

## 2022-03-26 NOTE — H&P (Signed)
Cephas Darby, MD [address]  [place]  [place], [place] [address]  Main: [phone]  Fax: [phone] Pager: [phone]  Primary Care Physician:  Frazier Richards, MD Primary Gastroenterologist:  Dr. Cephas Darby  Pre-Procedure History & Physical: HPI:  Monica Obrien is a 51 y.o. female is here for an colonoscopy.   Past Medical History:  Diagnosis Date   Anemia    Complication of anesthesia    elevated blood pressure   Difficult intubation    Headache     Past Surgical History:  Procedure Laterality Date   BILATERAL SALPINGECTOMY Bilateral 08/26/2015   Procedure: BILATERAL SALPINGECTOMY;  Surgeon: Lorette Ang, MD;  Location: [facility] ORS;  Service: Gynecology;  Laterality: Bilateral;   CYSTOSCOPY  08/26/2015   Procedure: CYSTOSCOPY;  Surgeon: Lorette Ang, MD;  Location: [facility] ORS;  Service: Gynecology;;   LAPAROSCOPIC HYSTERECTOMY N/A 08/26/2015   Procedure: HYSTERECTOMY TOTAL LAPAROSCOPIC;  Surgeon: Lorette Ang, MD;  Location: [facility] ORS;  Service: Gynecology;  Laterality: N/A;   TUBAL LIGATION      Prior to Admission medications   Medication Sig Start Date End Date Taking? Authorizing Provider  ondansetron (ZOFRAN) 4 MG tablet Take 1 tablet (4 mg total) by mouth every 8 (eight) hours as needed for up to 10 doses for nausea or vomiting. 09/16/20   Lucrezia Starch, MD    Allergies as of 03/07/2022 - Review Complete 09/16/2020  Allergen Reaction Noted   Imitrex [sumatriptan] Swelling 08/16/2015    Family History  Problem Relation Age of Onset   Osteoporosis Mother    Breast cancer Neg Hx     Social History   Socioeconomic History   Marital status: Divorced    Spouse name: Not on file   Number of children: Not on file   Years of education: Not on file   Highest education level: Not on file  Occupational History   Not on file  Tobacco Use   Smoking status: Never   Smokeless tobacco: Never  Vaping Use   Vaping Use: Never  used  Substance and Sexual Activity   Alcohol use: No   Drug use: No   Sexual activity: Not on file  Other Topics Concern   Not on file  Social History Narrative   Not on file   Social Determinants of Health   Financial Resource Strain: Not on file  Food Insecurity: Not on file  Transportation Needs: Not on file  Physical Activity: Not on file  Stress: Not on file  Social Connections: Not on file  Intimate Partner Violence: Not on file    Review of Systems: See HPI, otherwise negative ROS  Physical Exam: BP 124/89   Pulse 83   Temp (!) 96.8 F (36 C) (Temporal)   Resp 18   Ht 5' (1.524 m)   Wt 70.5 kg   LMP 06/08/2015   SpO2 99%   BMI 30.33 kg/m  General:   Alert,  pleasant and cooperative in NAD Head:  Normocephalic and atraumatic. Neck:  Supple; no masses or thyromegaly. Lungs:  Clear throughout to auscultation.    Heart:  Regular rate and rhythm. Abdomen:  Soft, nontender and nondistended. Normal bowel sounds, without guarding, and without rebound.   Neurologic:  Alert and  oriented x4;  grossly normal neurologically.  Impression/Plan: Monica Obrien is here for an colonoscopy to be performed for colon cancer screening  Risks, benefits, limitations, and alternatives regarding  colonoscopy have been reviewed with the  patient.  Questions have been answered.  All parties agreeable.   Sherri Sear, MD  03/26/2022, 11:11 AM

## 2022-03-26 NOTE — Anesthesia Postprocedure Evaluation (Signed)
Anesthesia Post Note  Patient: Monica Obrien  Procedure(s) Performed: COLONOSCOPY WITH PROPOFOL  Patient location during evaluation: Endoscopy Anesthesia Type: General Level of consciousness: awake and alert Pain management: pain level controlled Vital Signs Assessment: post-procedure vital signs reviewed and stable Respiratory status: spontaneous breathing, nonlabored ventilation, respiratory function stable and patient connected to nasal cannula oxygen Cardiovascular status: blood pressure returned to baseline and stable Postop Assessment: no apparent nausea or vomiting Anesthetic complications: no   No notable events documented.   Last Vitals:  Vitals:   03/26/22 1155 03/26/22 1205  BP: 115/81 (!) 122/91  Pulse: 72 64  Resp: 16 19  Temp:    SpO2: 100% 100%    Last Pain:  Vitals:   03/26/22 1155  TempSrc:   PainSc: 0-No pain                 Martha Clan

## 2022-03-26 NOTE — Transfer of Care (Signed)
Immediate Anesthesia Transfer of Care Note  Patient: Carmin Muskrat  Procedure(s) Performed: Procedure(s) with comments: COLONOSCOPY WITH PROPOFOL (N/A) - SPANISH INTERPRETER  Patient Location: PACU and Endoscopy Unit  Anesthesia Type:General  Level of Consciousness: sedated  Airway & Oxygen Therapy: Patient Spontanous Breathing and Patient connected to nasal cannula oxygen  Post-op Assessment: Report given to RN and Post -op Vital signs reviewed and stable  Post vital signs: Reviewed and stable  Last Vitals:  Vitals:   03/26/22 1036 03/26/22 1145  BP: 124/89 108/78  Pulse: 83 74  Resp: 18 18  Temp: (!) 36 C (!) 36.2 C  SpO2: 41% 146%    Complications: No apparent anesthesia complications

## 2022-03-26 NOTE — Anesthesia Preprocedure Evaluation (Signed)
Anesthesia Evaluation  Patient identified by MRN, date of birth, ID band Patient awake    Reviewed: Allergy & Precautions, H&P , NPO status , Patient's Chart, lab work & pertinent test results, reviewed documented beta blocker date and time   History of Anesthesia Complications (+) PONV, DIFFICULT AIRWAY and history of anesthetic complications  Airway Mallampati: II   Neck ROM: full    Dental  (+) Poor Dentition   Pulmonary neg pulmonary ROS   Pulmonary exam normal        Cardiovascular negative cardio ROS Normal cardiovascular exam Rhythm:regular Rate:Normal     Neuro/Psych  Headaches, neg Seizures negative neurological ROS  negative psych ROS   GI/Hepatic negative GI ROS, Neg liver ROS,,,  Endo/Other  negative endocrine ROS    Renal/GU negative Renal ROS  negative genitourinary   Musculoskeletal   Abdominal   Peds  Hematology negative hematology ROS (+) Blood dyscrasia, anemia   Anesthesia Other Findings Past Medical History:   Anemia                                                       Headache                                                     Complication of anesthesia                                     Comment:elevated blood pressure Past Surgical History:   TUBAL LIGATION                                              BMI    Body Mass Index   28.51 kg/m 2     Reproductive/Obstetrics                              Anesthesia Physical Anesthesia Plan  ASA: 1  Anesthesia Plan: General   Post-op Pain Management:    Induction: Intravenous  PONV Risk Score and Plan: 4 or greater and Propofol infusion and TIVA  Airway Management Planned: Natural Airway and Nasal Cannula  Additional Equipment:   Intra-op Plan:   Post-operative Plan:   Informed Consent: I have reviewed the patients History and Physical, chart, labs and discussed the procedure including the risks,  benefits and alternatives for the proposed anesthesia with the patient or authorized representative who has indicated his/her understanding and acceptance.     Dental Advisory Given  Plan Discussed with: CRNA  Anesthesia Plan Comments:          Anesthesia Quick Evaluation

## 2022-03-26 NOTE — Op Note (Signed)
[facility] Gastroenterology Patient Name: Monica Obrien Procedure Date: 03/26/2022 11:09 AM MRN: [id number] Account #: [id number] Date of Birth: [birth year] Admit Type: Outpatient Age: 51 Room: [facility] ENDO ROOM 4 Gender: Female Note Status: Finalized Instrument Name: Jasper Riling [id number] Procedure:             Colonoscopy Indications:           Screening for colorectal malignant neoplasm, This is                         the patient's first colonoscopy Providers:             Lin Landsman MD, MD Referring MD:          Hattie Perch. Adamo (Referring MD) Medicines:             General Anesthesia Complications:         No immediate complications. Estimated blood loss: None. Procedure:             Pre-Anesthesia Assessment:                        - Prior to the procedure, a History and Physical was                         performed, and patient medications and allergies were                         reviewed. The patient is competent. The risks and                         benefits of the procedure and the sedation options and                         risks were discussed with the patient. All questions                         were answered and informed consent was obtained.                         Patient identification and proposed procedure were                         verified by the physician, the nurse, the                         anesthesiologist, the anesthetist and the technician                         in the pre-procedure area in the procedure room in the                         endoscopy suite. Mental Status Examination: alert and                         oriented. Airway Examination: normal oropharyngeal                         airway and neck mobility. Respiratory Examination:  clear to auscultation. CV Examination: normal.                         Prophylactic Antibiotics: The patient does not require                          prophylactic antibiotics. Prior Anticoagulants: The                         patient has taken no anticoagulant or antiplatelet                         agents. ASA Grade Assessment: I - A normal, healthy                         patient. After reviewing the risks and benefits, the                         patient was deemed in satisfactory condition to                         undergo the procedure. The anesthesia plan was to use                         general anesthesia. Immediately prior to                         administration of medications, the patient was                         re-assessed for adequacy to receive sedatives. The                         heart rate, respiratory rate, oxygen saturations,                         blood pressure, adequacy of pulmonary ventilation, and                         response to care were monitored throughout the                         procedure. The physical status of the patient was                         re-assessed after the procedure.                        After obtaining informed consent, the colonoscope was                         passed under direct vision. Throughout the procedure,                         the patient's blood pressure, pulse, and oxygen                         saturations were monitored continuously. The  Colonoscope was introduced through the anus and                         advanced to the the cecum, identified by appendiceal                         orifice and ileocecal valve. The colonoscopy was                         performed without difficulty. The patient tolerated                         the procedure well. The quality of the bowel                         preparation was evaluated using the BBPS [facility] Bowel                         Preparation Scale) with scores of: Right Colon = 3,                         Transverse Colon = 3 and Left Colon = 3 (entire mucosa                         seen  well with no residual staining, small fragments                         of stool or opaque liquid). The total BBPS score                         equals 9. The ileocecal valve, appendiceal orifice,                         and rectum were photographed. Findings:      The perianal and digital rectal examinations were normal. Pertinent       negatives include normal sphincter tone and no palpable rectal lesions.      The entire examined colon appeared normal.      Non-bleeding external hemorrhoids were found during retroflexion. The       hemorrhoids were small. Impression:            - The entire examined colon is normal.                        - Non-bleeding external hemorrhoids.                        - No specimens collected. Recommendation:        - Discharge patient to home (with escort).                        - Resume previous diet today.                        - Continue present medications.                        - Repeat colonoscopy in 10 years for screening  purposes. Procedure Code(s):     --- Professional ---                        Y7062, Colorectal cancer screening; colonoscopy on                         individual not meeting criteria for high risk Diagnosis Code(s):     --- Professional ---                        Z12.11, Encounter for screening for malignant neoplasm                         of colon                        K64.4, Residual hemorrhoidal skin tags CPT copyright 2022 American Medical Association. All rights reserved. The codes documented in this report are preliminary and upon coder review may  be revised to meet current compliance requirements. Dr. Ulyess Mort Lin Landsman MD, MD 03/26/2022 11:42:28 AM This report has been signed electronically. Number of Addenda: 0 Note Initiated On: 03/26/2022 11:09 AM Scope Withdrawal Time: 0 hours 9 minutes 19 seconds  Total Procedure Duration: 0 hours 10 minutes 57 seconds  Estimated  Blood Loss:  Estimated blood loss: none.      [facility]

## 2022-03-27 ENCOUNTER — Encounter: Payer: Self-pay | Admitting: Gastroenterology

## 2022-10-16 ENCOUNTER — Emergency Department: Payer: BC Managed Care – PPO

## 2022-10-16 ENCOUNTER — Other Ambulatory Visit: Payer: Self-pay

## 2022-10-16 ENCOUNTER — Emergency Department
Admission: EM | Admit: 2022-10-16 | Discharge: 2022-10-16 | Disposition: A | Discharge status: Home / Self Care | Payer: BC Managed Care – PPO | Attending: Emergency Medicine | Admitting: Emergency Medicine

## 2022-10-16 DIAGNOSIS — G43809 Other migraine, not intractable, without status migrainosus: Secondary | ICD-10-CM | POA: Diagnosis not present

## 2022-10-16 DIAGNOSIS — R519 Headache, unspecified: Secondary | ICD-10-CM | POA: Diagnosis present

## 2022-10-16 LAB — BASIC METABOLIC PANEL
Anion gap: 8 (ref 5–15)
BUN: 13 mg/dL (ref 6–20)
CO2: 25 mmol/L (ref 22–32)
Calcium: 9.5 mg/dL (ref 8.9–10.3)
Chloride: 102 mmol/L (ref 98–111)
Creatinine, Ser: 0.42 mg/dL — ABNORMAL LOW (ref 0.44–1.00)
GFR, Estimated: >60 mL/min (ref >60)
Glucose, Bld: 97 mg/dL (ref 70–99)
Potassium: 3.7 mmol/L (ref 3.5–5.1)
Sodium: 135 mmol/L (ref 135–145)

## 2022-10-16 LAB — CBC
HCT: 42 % (ref 36.0–46.0)
Hemoglobin: 13.8 g/dL (ref 12.0–15.0)
MCH: 28 pg (ref 26.0–34.0)
MCHC: 32.9 g/dL (ref 30.0–36.0)
MCV: 85.4 fL (ref 80.0–100.0)
Platelets: 356 10*3/uL (ref 150–400)
RBC: 4.92 MIL/uL (ref 3.87–5.11)
RDW: 12.7 % (ref 11.5–15.5)
WBC: 10.9 10*3/uL — ABNORMAL HIGH (ref 4.0–10.5)
nRBC: 0 % (ref 0.0–0.2)

## 2022-10-16 LAB — URINALYSIS, ROUTINE W REFLEX MICROSCOPIC
Bilirubin Urine: NEGATIVE
Glucose, UA: NEGATIVE mg/dL
Hgb urine dipstick: NEGATIVE
Ketones, ur: NEGATIVE mg/dL
Leukocytes,Ua: NEGATIVE
Nitrite: NEGATIVE
Protein, ur: NEGATIVE mg/dL
Specific Gravity, Urine: 1.005 (ref 1.005–1.030)
pH: 9 — ABNORMAL HIGH (ref 5.0–8.0)

## 2022-10-16 LAB — HEPATIC FUNCTION PANEL
ALT: 25 U/L (ref 0–44)
AST: 24 U/L (ref 15–41)
Albumin: 4.4 g/dL (ref 3.5–5.0)
Alkaline Phosphatase: 115 U/L (ref 38–126)
Bilirubin, Direct: <0.1 mg/dL (ref 0.0–0.2)
Total Bilirubin: 0.4 mg/dL (ref 0.3–1.2)
Total Protein: 7.7 g/dL (ref 6.5–8.1)

## 2022-10-16 LAB — LIPASE, BLOOD: Lipase: 31 U/L (ref 11–51)

## 2022-10-16 MED ORDER — DIPHENHYDRAMINE HCL 50 MG/ML IJ SOLN
50.0000 mg | Freq: Once | INTRAMUSCULAR | Status: AC
  Administered 2022-10-16: 50 mg via INTRAVENOUS
  Filled 2022-10-16: qty 1

## 2022-10-16 MED ORDER — KETOROLAC TROMETHAMINE 15 MG/ML IJ SOLN
15.0000 mg | Freq: Once | INTRAMUSCULAR | Status: AC
  Administered 2022-10-16: 15 mg via INTRAVENOUS
  Filled 2022-10-16: qty 1

## 2022-10-16 MED ORDER — IOHEXOL 350 MG/ML SOLN
75.0000 mL | Freq: Once | INTRAVENOUS | Status: AC | PRN
  Administered 2022-10-16: 75 mL via INTRAVENOUS

## 2022-10-16 MED ORDER — METOCLOPRAMIDE HCL 5 MG/ML IJ SOLN
10.0000 mg | Freq: Once | INTRAMUSCULAR | Status: AC
  Administered 2022-10-16: 10 mg via INTRAVENOUS
  Filled 2022-10-16: qty 2

## 2022-10-16 NOTE — ED Notes (Signed)
See triage notes. Patient started with a severe headache and blurred vision earlier this morning. Still has the headache.

## 2022-10-16 NOTE — ED Provider Notes (Signed)
[facility] Provider Note  Patient Contact: 4:43 PM (approximate)   History   Blurred Vision and Headache   HPI  Monica Obrien is a 52 y.o. female with a history of anemia and headaches, presents to the emergency department with a headache that awoken her from sleep.  Patient describes the headache as being intense and frontal in nature with associated blurry vision that has since improved.  She also states that she had some nausea and states that her stomach feels "hot".  Patient reports that she does have a history of migraines and used to take migraine medication.  She had no new medication changes and has had no falls or mechanisms of trauma.  She denies chest pain, chest tightness or shortness of breath.      Physical Exam   Triage Vital Signs: ED Triage Vitals  Enc Vitals Group     BP 10/16/22 1528 126/89     Pulse Rate 10/16/22 1528 69     Resp 10/16/22 1528 18     Temp 10/16/22 1528 97.9 F (36.6 C)     Temp Source 10/16/22 1528 Oral     SpO2 10/16/22 1528 96 %     Weight 10/16/22 1531 155 lb 6.8 oz (70.5 kg)     Height 10/16/22 1529 5' (1.524 m)     Head Circumference --      Peak Flow --      Pain Score 10/16/22 1530 8     Pain Loc --      Pain Edu? --      Excl. in GC? --     Most recent vital signs: Vitals:   10/16/22 1528  BP: 126/89  Pulse: 69  Resp: 18  Temp: 97.9 F (36.6 C)  SpO2: 96%     General: Alert and in no acute distress. Eyes:  PERRL. EOMI. Head: No acute traumatic findings ENT:      Nose: No congestion/rhinnorhea.      Mouth/Throat: Mucous membranes are moist.  Neck: No stridor. No cervical spine tenderness to palpation. Cardiovascular:  Good peripheral perfusion Respiratory: Normal respiratory effort without tachypnea or retractions. Lungs CTAB. Good air entry to the bases with no decreased or absent breath sounds. Gastrointestinal: Bowel sounds 4 quadrants. Soft and nontender to palpation. No  guarding or rigidity. No palpable masses. No distention. No CVA tenderness. Musculoskeletal: Full range of motion to all extremities.  Neurologic:  No gross focal neurologic deficits are appreciated.  Skin:   No rash noted Other:   ED Results / Procedures / Treatments   Labs (all labs ordered are listed, but only abnormal results are displayed) Labs Reviewed  BASIC METABOLIC PANEL - Abnormal; Notable for the following components:      Result Value   Creatinine, Ser 0.42 (*)    All other components within normal limits  CBC - Abnormal; Notable for the following components:   WBC 10.9 (*)    All other components within normal limits  URINALYSIS, ROUTINE W REFLEX MICROSCOPIC - Abnormal; Notable for the following components:   Color, Urine STRAW (*)    APPearance CLEAR (*)    pH 9.0 (*)    All other components within normal limits  HEPATIC FUNCTION PANEL  LIPASE, BLOOD  CBG MONITORING, ED       RADIOLOGY  I personally viewed and evaluated these images as part of my medical decision making, as well as reviewing the written report by the radiologist.  ED  Provider Interpretation: CT head without contrast and CT angio head shows no acute abnormality.   PROCEDURES:  Critical Care performed: No  Procedures   MEDICATIONS ORDERED IN ED: Medications  iohexol (OMNIPAQUE) 350 MG/ML injection 75 mL (75 mLs Intravenous Contrast Given 10/16/22 1648)  diphenhydrAMINE (BENADRYL) injection 50 mg (50 mg Intravenous Given 10/16/22 1738)  metoCLOPramide (REGLAN) injection 10 mg (10 mg Intravenous Given 10/16/22 1739)  ketorolac (TORADOL) 15 MG/ML injection 15 mg (15 mg Intravenous Given 10/16/22 1738)     IMPRESSION / MDM / ASSESSMENT AND PLAN / ED COURSE  I reviewed the triage vital signs and the nursing notes.                              Assessment and plan Headache 52 year old female presents to the emergency department with an intense headache that awoken her from sleep and had  associated blurry vision that is since improved.  Vital signs were reassuring at triage.  On exam, patient is alert and nontoxic-appearing with no apparent neurodeficits.   CBC and BMP reassuring.  Urinalysis shows no signs of UTI.  No acute abnormalities on CT head without contrast.  Will obtain CT angio head and will reassess.  CT angio head shows no acute abnormality.  Patient was given migraine cocktail of Benadryl, Reglan and Toradol and she reported that her headache resolved.  Return precautions were given to return with new or worsening symptoms.  All patient questions were answered.   FINAL CLINICAL IMPRESSION(S) / ED DIAGNOSES   Final diagnoses:  Other migraine without status migrainosus, not intractable     Rx / DC Orders   ED Discharge Orders     None        Note:  This document was prepared using Dragon voice recognition software and may include unintentional dictation errors.   Pia Mau Carter, PA-C 10/16/22 1914    Pilar Jarvis, MD 10/16/22 [phone]

## 2022-10-16 NOTE — ED Triage Notes (Signed)
Pt here with blurred vision and a sudden headache at 1030. Pt states her vision was blurred for 40 mins and the headache came on along with nausea. Pt also has abd pain, "it's really hot on the inside" and is bloated. Pt is still having headache pain. Pt ambulatory to triage.

## 2022-12-07 ENCOUNTER — Encounter: Payer: Self-pay | Admitting: Family Medicine

## 2022-12-21 ENCOUNTER — Other Ambulatory Visit: Payer: Self-pay

## 2022-12-24 ENCOUNTER — Telehealth: Payer: Self-pay | Admitting: Physician Assistant

## 2022-12-24 NOTE — Telephone Encounter (Signed)
The patient and her daughter called in to check her appointment for Wednesday.

## 2022-12-26 ENCOUNTER — Other Ambulatory Visit: Payer: Self-pay

## 2022-12-26 ENCOUNTER — Encounter: Payer: Self-pay | Admitting: Physician Assistant

## 2022-12-26 ENCOUNTER — Ambulatory Visit: Payer: BC Managed Care – PPO | Admitting: Physician Assistant

## 2022-12-26 VITALS — BP 108/72 | HR 73 | Temp 98.0°F | Ht 60.0 in | Wt 163.0 lb

## 2022-12-26 DIAGNOSIS — R1013 Epigastric pain: Secondary | ICD-10-CM | POA: Diagnosis not present

## 2022-12-26 DIAGNOSIS — K219 Gastro-esophageal reflux disease without esophagitis: Secondary | ICD-10-CM

## 2022-12-26 MED ORDER — OMEPRAZOLE 20 MG PO CPDR: 20.0000 mg | DELAYED_RELEASE_CAPSULE | Freq: Two times a day (BID) | ORAL | 5 refills | Status: AC

## 2022-12-26 NOTE — Progress Notes (Signed)
Celso Amy, PA-C [address]  [address]  [place], [place] [address]  Main: [phone]  Fax: [phone]   Primary Care Physician: Abram Sander, MD  Primary Gastroenterologist:  Celso Amy, PA-C / Dr. Lannette Donath    CC:  GERD, epigastric burning  HPI: Monica Obrien is a 52 y.o. female presents to evaluate acid reflux.  Currently taking Omeprazole 20mg  BID for GERD.  She reports having breakthrough heartburn, acid regurgitation, and epigastric pain on this treatment.  She has had acid reflux symptoms for many years.  Omeprazole is not working well.  Acid reflux triggers include spicy, acidic, fatty and greasy foods.  She has a burning in her stomach.  Has tried OTC antacids with little benefit.  No previous EGD or H. pylori test.  RUQ ultrasound 04/2018 showed fatty liver and thickened gallbladder with no gallstones.  First screening colonoscopy 03/2022 was normal.  10-year repeat.  We are using an interpreter from language services.  Current Outpatient Medications  Medication Sig Dispense Refill   omeprazole (PRILOSEC) 20 MG capsule Take 1 capsule (20 mg total) by mouth 2 (two) times daily before a meal. 30 capsule 5   No current facility-administered medications for this visit.    Allergies as of 12/26/2022 - Review Complete 12/26/2022  Allergen Reaction Noted   Imitrex [sumatriptan] Swelling 08/16/2015    Past Medical History:  Diagnosis Date   Anemia    Complication of anesthesia    elevated blood pressure   Difficult intubation    GERD (gastroesophageal reflux disease)    Migraines     Past Surgical History:  Procedure Laterality Date   BILATERAL SALPINGECTOMY Bilateral 08/26/2015   Procedure: BILATERAL SALPINGECTOMY;  Surgeon: Ala Dach, MD;  Location: [facility] ORS;  Service: Gynecology;  Laterality: Bilateral;   COLONOSCOPY WITH PROPOFOL N/A 03/26/2022   Procedure: COLONOSCOPY WITH PROPOFOL;  Surgeon: Toney Reil, MD;   [facility] ENDOSCOPY;  Service: Gastroenterology;  Laterality: N/A;  SPANISH INTERPRETER   CYSTOSCOPY  08/26/2015   Procedure: CYSTOSCOPY;  Surgeon: Ala Dach, MD;  Location: [facility] ORS;  Service: Gynecology;;   LAPAROSCOPIC HYSTERECTOMY N/A 08/26/2015   Procedure: HYSTERECTOMY TOTAL LAPAROSCOPIC;  Surgeon: Ala Dach, MD;  Location: [facility] ORS;  Service: Gynecology;  Laterality: N/A;   TUBAL LIGATION      Review of Systems:    All systems reviewed and negative except where noted in HPI.   Physical Examination:   BP 108/72   Pulse 73   Temp 98 F (36.7 C)   Ht 5' (1.524 m)   Wt 163 lb (73.9 kg)   LMP 06/08/2015   BMI 31.83 kg/m   General: Well-nourished, well-developed in no acute distress.  Lungs: Clear to auscultation bilaterally. Non-labored. Heart: Regular rate and rhythm, no murmurs rubs or gallops.  Abdomen: Bowel sounds are normal; Abdomen is Soft; No hepatosplenomegaly, masses or hernias;  No Abdominal Tenderness; No guarding or rebound tenderness. Neuro: Alert and oriented x 3.  Grossly intact.  Psych: Alert and cooperative, normal mood and affect.   Imaging Studies: No results found.  Assessment and Plan:   Monica Obrien is a 52 y.o. y/o female presents for   1.  Chronic GERD, not controlled on PPI  Continue omeprazole 20 Mg twice daily  Recommend Lifestyle Modifications to prevent Acid Reflux.  Rec. Avoid coffee, sodas, peppermint, citrus fruits, and spicey foods.  Avoid eating 2-3 hours before bedtime.   2.  Epigastric pain  Scheduling  EGD I discussed risks of EGD with patient to include risk of bleeding, perforation, and risk of sedation.  Patient expressed understanding and agrees to proceed with EGD.   Avoid NSAIDs  Consider H. pylori testing during EGD  If abdominal pain persist, then recommend abdominal ultrasound or CT.  Celso Amy, PA-C  Follow up in 3 months with TG.

## 2023-01-30 ENCOUNTER — Encounter: Payer: Self-pay | Admitting: Gastroenterology

## 2023-01-31 ENCOUNTER — Encounter: Payer: Self-pay | Admitting: Gastroenterology

## 2023-01-31 ENCOUNTER — Ambulatory Visit
Admission: RE | Admit: 2023-01-31 | Discharge: 2023-01-31 | Disposition: A | Discharge status: Home / Self Care | Payer: BC Managed Care – PPO | Attending: Gastroenterology | Admitting: Gastroenterology

## 2023-01-31 ENCOUNTER — Encounter
Admission: RE | Disposition: A | Discharge status: Home / Self Care | Payer: Self-pay | Source: Home / Self Care | Attending: Gastroenterology

## 2023-01-31 ENCOUNTER — Ambulatory Visit: Payer: BC Managed Care – PPO | Admitting: Certified Registered"

## 2023-01-31 DIAGNOSIS — G43909 Migraine, unspecified, not intractable, without status migrainosus: Secondary | ICD-10-CM | POA: Insufficient documentation

## 2023-01-31 DIAGNOSIS — R1013 Epigastric pain: Secondary | ICD-10-CM

## 2023-01-31 DIAGNOSIS — K2210 Ulcer of esophagus without bleeding: Secondary | ICD-10-CM

## 2023-01-31 DIAGNOSIS — K2100 Gastro-esophageal reflux disease with esophagitis, without bleeding: Secondary | ICD-10-CM | POA: Insufficient documentation

## 2023-01-31 DIAGNOSIS — D649 Anemia, unspecified: Secondary | ICD-10-CM | POA: Diagnosis not present

## 2023-01-31 DIAGNOSIS — K449 Diaphragmatic hernia without obstruction or gangrene: Secondary | ICD-10-CM | POA: Diagnosis not present

## 2023-01-31 DIAGNOSIS — I10 Essential (primary) hypertension: Secondary | ICD-10-CM | POA: Diagnosis not present

## 2023-01-31 DIAGNOSIS — Z79899 Other long term (current) drug therapy: Secondary | ICD-10-CM | POA: Diagnosis not present

## 2023-01-31 DIAGNOSIS — K2950 Unspecified chronic gastritis without bleeding: Secondary | ICD-10-CM | POA: Diagnosis not present

## 2023-01-31 DIAGNOSIS — R140 Abdominal distension (gaseous): Secondary | ICD-10-CM

## 2023-01-31 DIAGNOSIS — K219 Gastro-esophageal reflux disease without esophagitis: Secondary | ICD-10-CM

## 2023-01-31 HISTORY — PX: ESOPHAGOGASTRODUODENOSCOPY (EGD) WITH PROPOFOL: SHX5813

## 2023-01-31 HISTORY — PX: BIOPSY: SHX5522

## 2023-01-31 SURGERY — ESOPHAGOGASTRODUODENOSCOPY (EGD) WITH PROPOFOL
Anesthesia: General

## 2023-01-31 MED ORDER — SODIUM CHLORIDE 0.9 % IV SOLN: INTRAVENOUS | Status: DC

## 2023-01-31 MED ORDER — LIDOCAINE HCL (CARDIAC) PF 100 MG/5ML IV SOSY
PREFILLED_SYRINGE | INTRAVENOUS | Status: DC | PRN
  Administered 2023-01-31: 50 mg via INTRAVENOUS

## 2023-01-31 MED ORDER — PROPOFOL 10 MG/ML IV BOLUS
INTRAVENOUS | Status: DC | PRN
  Administered 2023-01-31: 90 mg via INTRAVENOUS
  Administered 2023-01-31: 30 mg via INTRAVENOUS
  Administered 2023-01-31: 40 mg via INTRAVENOUS

## 2023-01-31 NOTE — Anesthesia Preprocedure Evaluation (Signed)
Anesthesia Evaluation  Patient identified by MRN, date of birth, ID band Patient awake    Reviewed: Allergy & Precautions, NPO status , Patient's Chart, lab work & pertinent test results  History of Anesthesia Complications (+) DIFFICULT AIRWAY and history of anesthetic complications  Airway Mallampati: III  TM Distance: <3 FB Neck ROM: full    Dental  (+) Chipped, Poor Dentition, Caps   Pulmonary neg pulmonary ROS, neg recent URI   Pulmonary exam normal        Cardiovascular Exercise Tolerance: Good negative cardio ROS Normal cardiovascular exam     Neuro/Psych  Headaches  negative psych ROS   GI/Hepatic Neg liver ROS,GERD  Controlled,,  Endo/Other  negative endocrine ROS    Renal/GU negative Renal ROS  negative genitourinary   Musculoskeletal   Abdominal   Peds  Hematology negative hematology ROS (+)   Anesthesia Other Findings Past Medical History: No date: Anemia     Comment:  elevated blood pressure No date: Difficult intubation No date: GERD (gastroesophageal reflux disease) No date: Migraines  Past Surgical History: 08/26/2015: BILATERAL SALPINGECTOMY; Bilateral     Comment:  Procedure: BILATERAL SALPINGECTOMY;  Surgeon: Ala Dach, MD;  Location: [facility] ORS;  Service: Gynecology;                Laterality: Bilateral; 03/26/2022: COLONOSCOPY WITH PROPOFOL; N/A     Comment:  Procedure: COLONOSCOPY WITH PROPOFOL;  Surgeon: Toney Reil, MD;  Location: [facility] ENDOSCOPY;  Service:               Gastroenterology;  Laterality: N/A;  SPANISH INTERPRETER 08/26/2015: CYSTOSCOPY     Comment:  Procedure: CYSTOSCOPY;  Surgeon: Ala Dach, MD;                Location: [facility] ORS;  Service: Gynecology;; 08/26/2015: LAPAROSCOPIC HYSTERECTOMY; N/A     Comment:  Procedure: HYSTERECTOMY TOTAL LAPAROSCOPIC;  Surgeon:               Ala Dach, MD;  Location: [facility] ORS;   Service:               Gynecology;  Laterality: N/A; No date: TUBAL LIGATION     Reproductive/Obstetrics negative OB ROS                             Anesthesia Physical Anesthesia Plan  ASA: 2  Anesthesia Plan: General   Post-op Pain Management:    Induction: Intravenous  PONV Risk Score and Plan: Propofol infusion and TIVA  Airway Management Planned: Natural Airway and Nasal Cannula  Additional Equipment:   Intra-op Plan:   Post-operative Plan:   Informed Consent: I have reviewed the patients History and Physical, chart, labs and discussed the procedure including the risks, benefits and alternatives for the proposed anesthesia with the patient or authorized representative who has indicated his/her understanding and acceptance.     Dental Advisory Given and Interpreter used for interveiw  Plan Discussed with: Anesthesiologist, CRNA and Surgeon  Anesthesia Plan Comments: (Patient consented for risks of anesthesia including but not limited to:  - adverse reactions to medications - risk of airway placement if required - damage to eyes, teeth, lips or other oral mucosa - nerve damage due to positioning  - sore throat  or hoarseness - Damage to heart, brain, nerves, lungs, other parts of body or loss of life  Patient voiced understanding and assent.)       Anesthesia Quick Evaluation

## 2023-01-31 NOTE — Plan of Care (Signed)
[facility] Tonsillectomy/Adenoidectomy, Postoperative PEDS care plan entered in error.

## 2023-01-31 NOTE — Transfer of Care (Signed)
Immediate Anesthesia Transfer of Care Note  Patient: Monica Obrien  Procedure(s) Performed: ESOPHAGOGASTRODUODENOSCOPY (EGD) WITH PROPOFOL BIOPSY  Patient Location: PACU  Anesthesia Type:MAC  Level of Consciousness: awake, alert , and oriented  Airway & Oxygen Therapy: Patient Spontanous Breathing  Post-op Assessment: Report given to RN and Post -op Vital signs reviewed and stable  Post vital signs: stable  Last Vitals:  Vitals Value Taken Time  BP 107/72 01/31/23 1130  Temp    Pulse    Resp 20 01/31/23 1131  SpO2    Vitals shown include unfiled device data.  Last Pain:  Vitals:   01/31/23 1045  TempSrc: Temporal  PainSc: 0-No pain         Complications: No notable events documented.

## 2023-01-31 NOTE — H&P (Signed)
Arlyss Repress, MD [address]  [address]  [place], [place] [address]  Main: [phone]  Fax: [phone] Pager: [phone]  Primary Care Physician:  Abram Sander, MD Primary Gastroenterologist:  Dr. Arlyss Repress  Pre-Procedure History & Physical: HPI:  Monica Obrien is a 52 y.o. female is here for an endoscopy.   Past Medical History:  Diagnosis Date   Anemia    Complication of anesthesia    elevated blood pressure   Difficult intubation    GERD (gastroesophageal reflux disease)    Migraines     Past Surgical History:  Procedure Laterality Date   BILATERAL SALPINGECTOMY Bilateral 08/26/2015   Procedure: BILATERAL SALPINGECTOMY;  Surgeon: Ala Dach, MD;  Location: [facility] ORS;  Service: Gynecology;  Laterality: Bilateral;   COLONOSCOPY WITH PROPOFOL N/A 03/26/2022   Procedure: COLONOSCOPY WITH PROPOFOL;  Surgeon: Toney Reil, MD;  Location: [facility] ENDOSCOPY;  Service: Gastroenterology;  Laterality: N/A;  SPANISH INTERPRETER   CYSTOSCOPY  08/26/2015   Procedure: CYSTOSCOPY;  Surgeon: Ala Dach, MD;  Location: [facility] ORS;  Service: Gynecology;;   LAPAROSCOPIC HYSTERECTOMY N/A 08/26/2015   Procedure: HYSTERECTOMY TOTAL LAPAROSCOPIC;  Surgeon: Ala Dach, MD;  Location: [facility] ORS;  Service: Gynecology;  Laterality: N/A;   TUBAL LIGATION      Prior to Admission medications   Medication Sig Start Date End Date Taking? Authorizing Provider  omeprazole (PRILOSEC) 20 MG capsule Take 1 capsule (20 mg total) by mouth 2 (two) times daily before a meal. 12/26/22 06/24/23  Celso Amy, PA-C    Allergies as of 12/27/2022 - Review Complete 12/26/2022  Allergen Reaction Noted   Imitrex [sumatriptan] Swelling 08/16/2015    Family History  Problem Relation Age of Onset   Osteoporosis Mother    Breast cancer Neg Hx     Social History   Socioeconomic History   Marital status: Divorced    Spouse name: Not on file   Number of children:  Not on file   Years of education: Not on file   Highest education level: Not on file  Occupational History   Not on file  Tobacco Use   Smoking status: Never   Smokeless tobacco: Never  Vaping Use   Vaping status: Never Used  Substance and Sexual Activity   Alcohol use: No   Drug use: No   Sexual activity: Not on file  Other Topics Concern   Not on file  Social History Narrative   Not on file   Social Determinants of Health   Financial Resource Strain: Not on file  Food Insecurity: Not on file  Transportation Needs: Not on file  Physical Activity: Not on file  Stress: Not on file  Social Connections: Not on file  Intimate Partner Violence: Not on file    Review of Systems: See HPI, otherwise negative ROS  Physical Exam: BP 136/85   Pulse 70   Temp (!) 96.6 F (35.9 C) (Temporal)   Resp 16   Wt 71.9 kg   LMP 06/08/2015   SpO2 100%   BMI 30.97 kg/m  General:   Alert,  pleasant and cooperative in NAD Head:  Normocephalic and atraumatic. Neck:  Supple; no masses or thyromegaly. Lungs:  Clear throughout to auscultation.    Heart:  Regular rate and rhythm. Abdomen:  Soft, nontender and nondistended. Normal bowel sounds, without guarding, and without rebound.   Neurologic:  Alert and  oriented x4;  grossly normal neurologically.  Impression/Plan: Monica Obrien  is here for an endoscopy to be performed for epigastric pain  Risks, benefits, limitations, and alternatives regarding  endoscopy have been reviewed with the patient.  Questions have been answered.  All parties agreeable.   Lannette Donath, MD  01/31/2023, 11:02 AM

## 2023-01-31 NOTE — Op Note (Signed)
[facility] Gastroenterology Patient Name: Monica Obrien Procedure Date: 01/31/2023 11:06 AM MRN: [id number] Account #: [id number] Date of Birth: September 20, 1970 Admit Type: Outpatient Age: 52 Room: [facility] ENDO ROOM 3 Gender: Female Note Status: Finalized Instrument Name: Upper Endoscope [id number] Procedure:             Upper GI endoscopy Indications:           Epigastric abdominal pain, Follow-up of                         gastro-esophageal reflux disease Providers:             Toney Reil MD, MD Referring MD:          Jeris Penta. Adamo (Referring MD) Medicines:             General Anesthesia Complications:         No immediate complications. Estimated blood loss: None. Procedure:             Pre-Anesthesia Assessment:                        - Prior to the procedure, a History and Physical was                         performed, and patient medications and allergies were                         reviewed. The patient is competent. The risks and                         benefits of the procedure and the sedation options and                         risks were discussed with the patient. All questions                         were answered and informed consent was obtained.                         Patient identification and proposed procedure were                         verified by the physician, the nurse, the                         anesthesiologist, the anesthetist and the technician                         in the pre-procedure area in the procedure room in the                         endoscopy suite. Mental Status Examination: alert and                         oriented. Airway Examination: normal oropharyngeal                         airway and neck mobility. Respiratory Examination:  clear to auscultation. CV Examination: normal.                         Prophylactic Antibiotics: The patient does not require                          prophylactic antibiotics. Prior Anticoagulants: The                         patient has taken no anticoagulant or antiplatelet                         agents. ASA Grade Assessment: II - A patient with mild                         systemic disease. After reviewing the risks and                         benefits, the patient was deemed in satisfactory                         condition to undergo the procedure. The anesthesia                         plan was to use general anesthesia. Immediately prior                         to administration of medications, the patient was                         re-assessed for adequacy to receive sedatives. The                         heart rate, respiratory rate, oxygen saturations,                         blood pressure, adequacy of pulmonary ventilation, and                         response to care were monitored throughout the                         procedure. The physical status of the patient was                         re-assessed after the procedure.                        After obtaining informed consent, the endoscope was                         passed under direct vision. Throughout the procedure,                         the patient's blood pressure, pulse, and oxygen                         saturations were monitored continuously. The Endoscope  was introduced through the mouth, and advanced to the                         second part of duodenum. The upper GI endoscopy was                         accomplished without difficulty. The patient tolerated                         the procedure well. Findings:      The duodenal bulb and second portion of the duodenum were normal.      The entire examined stomach was normal. Biopsies were taken with a cold       forceps for Helicobacter pylori testing.      The cardia and gastric fundus were normal on retroflexion.      Esophagogastric landmarks were identified: the  gastroesophageal junction       was found at 35 cm from the incisors.      LA Grade A (one or more mucosal breaks less than 5 mm, not extending       between tops of 2 mucosal folds) esophagitis with no bleeding was found       in the lower third of the esophagus.      A small hiatal hernia was present. Impression:            - Normal duodenal bulb and second portion of the                         duodenum.                        - Normal stomach. Biopsied.                        - Esophagogastric landmarks identified.                        - LA Grade A reflux esophagitis with no bleeding.                        - Small hiatal hernia. Recommendation:        - Discharge patient to home (with escort).                        - Resume previous diet today.                        - Continue present medications.                        - Await pathology results.                        - Follow an antireflux regimen.                        - Use Prilosec (omeprazole) 40 mg PO BID for 3 months. Procedure Code(s):     --- Professional ---                        [phone], Esophagogastroduodenoscopy, flexible,  transoral; with biopsy, single or multiple Diagnosis Code(s):     --- Professional ---                        K21.00, Gastro-esophageal reflux disease with                         esophagitis, without bleeding                        R10.13, Epigastric pain CPT copyright 2022 American Medical Association. All rights reserved. The codes documented in this report are preliminary and upon coder review may  be revised to meet current compliance requirements. Dr. Libby Maw Toney Reil MD, MD 01/31/2023 11:28:45 AM This report has been signed electronically. Number of Addenda: 0 Note Initiated On: 01/31/2023 11:06 AM Estimated Blood Loss:  Estimated blood loss: none. Estimated blood loss: none.      [facility]

## 2023-01-31 NOTE — Anesthesia Postprocedure Evaluation (Signed)
Anesthesia Post Note  Patient: Monica Obrien  Procedure(s) Performed: ESOPHAGOGASTRODUODENOSCOPY (EGD) WITH PROPOFOL BIOPSY  Patient location during evaluation: Endoscopy Anesthesia Type: General Level of consciousness: awake and alert Pain management: pain level controlled Vital Signs Assessment: post-procedure vital signs reviewed and stable Respiratory status: spontaneous breathing, nonlabored ventilation, respiratory function stable and patient connected to nasal cannula oxygen Cardiovascular status: blood pressure returned to baseline and stable Postop Assessment: no apparent nausea or vomiting Anesthetic complications: no   No notable events documented.   Last Vitals:  Vitals:   01/31/23 1130 01/31/23 1140  BP: 107/72   Pulse: 71 66  Resp: (!) 21   Temp: (!) 36.3 C   SpO2: 99% 100%    Last Pain:  Vitals:   01/31/23 1140  TempSrc:   PainSc: 0-No pain                 Cleda Mccreedy Layne Dilauro

## 2023-02-01 LAB — SURGICAL PATHOLOGY

## 2023-02-04 ENCOUNTER — Encounter: Payer: Self-pay | Admitting: Gastroenterology

## 2023-02-13 ENCOUNTER — Other Ambulatory Visit: Payer: Self-pay | Admitting: Family Medicine

## 2023-02-13 DIAGNOSIS — N6019 Diffuse cystic mastopathy of unspecified breast: Secondary | ICD-10-CM

## 2023-02-13 DIAGNOSIS — Z1231 Encounter for screening mammogram for malignant neoplasm of breast: Secondary | ICD-10-CM

## 2023-03-27 ENCOUNTER — Ambulatory Visit (INDEPENDENT_AMBULATORY_CARE_PROVIDER_SITE_OTHER): Payer: BC Managed Care – PPO | Admitting: Physician Assistant

## 2023-03-27 ENCOUNTER — Encounter: Payer: Self-pay | Admitting: Physician Assistant

## 2023-03-27 VITALS — BP 116/77 | HR 91 | Temp 98.6°F | Ht 60.0 in | Wt 160.0 lb

## 2023-03-27 DIAGNOSIS — K589 Irritable bowel syndrome without diarrhea: Secondary | ICD-10-CM

## 2023-03-27 DIAGNOSIS — K449 Diaphragmatic hernia without obstruction or gangrene: Secondary | ICD-10-CM | POA: Diagnosis not present

## 2023-03-27 DIAGNOSIS — R109 Unspecified abdominal pain: Secondary | ICD-10-CM

## 2023-03-27 DIAGNOSIS — K219 Gastro-esophageal reflux disease without esophagitis: Secondary | ICD-10-CM

## 2023-03-27 MED ORDER — DICYCLOMINE HCL 10 MG PO CAPS: 10.0000 mg | ORAL_CAPSULE | Freq: Three times a day (TID) | ORAL | 3 refills | Status: AC

## 2023-03-27 NOTE — Progress Notes (Signed)
Celso Amy, PA-C [address]  [address]  [place], [place] [address]  Main: [phone]  Fax: [phone]   Primary Care Physician: Abram Sander, MD  Primary Gastroenterologist:  Celso Amy, PA-C / Dr. Lannette Donath    CC: Follow-up GERD, epigastric pain  HPI: Monica Obrien is a 52 y.o. female returns for 49-month follow-up of GERD and epigastric burning.  EGD 01/27/2023 by Dr. Allegra Lai: LA grade a reflux esophagitis, small hiatal hernia, otherwise normal.  Prilosec was increased to 40 mg twice daily for 3 months.  RUQ ultrasound 04/2018 showed fatty liver and thickened gallbladder with no gallstones.   First screening colonoscopy 03/2022 was normal.  10-year repeat.   We are using a Spanish interpreter from language services.  Currently reflux is under control on Omeprazole.  She has no more upper GI symptoms.  Patient reports she does have intermittent episodes of lower abdominal cramping which comes and goes.  She denies diarrhea, constipation, rectal bleeding, weight loss, or alarm symptoms.  Current Outpatient Medications  Medication Sig Dispense Refill   dicyclomine (BENTYL) 10 MG capsule Take 1 capsule (10 mg total) by mouth 3 (three) times daily before meals. 90 capsule 3   omeprazole (PRILOSEC) 20 MG capsule Take 1 capsule (20 mg total) by mouth 2 (two) times daily before a meal. 30 capsule 5   No current facility-administered medications for this visit.    Allergies as of 03/27/2023 - Review Complete 03/27/2023  Allergen Reaction Noted   Imitrex [sumatriptan] Swelling 08/16/2015    Past Medical History:  Diagnosis Date   Anemia    Complication of anesthesia    elevated blood pressure   Difficult intubation    GERD (gastroesophageal reflux disease)    Migraines     Past Surgical History:  Procedure Laterality Date   BILATERAL SALPINGECTOMY Bilateral 08/26/2015   Procedure: BILATERAL SALPINGECTOMY;  Surgeon: Ala Dach, MD;   Location: [facility] ORS;  Service: Gynecology;  Laterality: Bilateral;   BIOPSY  01/31/2023   Procedure: BIOPSY;  Surgeon: Toney Reil, MD;  Location: [facility] ENDOSCOPY;  Service: Gastroenterology;;   COLONOSCOPY WITH PROPOFOL N/A 03/26/2022   Procedure: COLONOSCOPY WITH PROPOFOL;  Surgeon: Toney Reil, MD;  Location: [facility] ENDOSCOPY;  Service: Gastroenterology;  Laterality: N/A;  SPANISH INTERPRETER   CYSTOSCOPY  08/26/2015   Procedure: CYSTOSCOPY;  Surgeon: Ala Dach, MD;  Location: [facility] ORS;  Service: Gynecology;;   ESOPHAGOGASTRODUODENOSCOPY (EGD) WITH PROPOFOL N/A 01/31/2023   Procedure: ESOPHAGOGASTRODUODENOSCOPY (EGD) WITH PROPOFOL;  Surgeon: Toney Reil, MD;  Location: [facility] ENDOSCOPY;  Service: Gastroenterology;  Laterality: N/A;  SPANISH INTERPRETER   LAPAROSCOPIC HYSTERECTOMY N/A 08/26/2015   Procedure: HYSTERECTOMY TOTAL LAPAROSCOPIC;  Surgeon: Ala Dach, MD;  Location: [facility] ORS;  Service: Gynecology;  Laterality: N/A;   TUBAL LIGATION      Review of Systems:    All systems reviewed and negative except where noted in HPI.   Physical Examination:   BP 116/77   Pulse 91   Temp 98.6 F (37 C)   Ht 5' (1.524 m)   Wt 160 lb (72.6 kg)   LMP 06/08/2015   BMI 31.25 kg/m   General: Well-nourished, well-developed in no acute distress.  Neuro: Alert and oriented x 3.  Grossly intact.  Psych: Alert and cooperative, normal mood and affect.   Imaging Studies: No results found.  Assessment and Plan:   Monica Obrien is a 52 y.o. y/o female returns for follow-up of:  1.  GERD  Continue omeprazole daily.  Recommend Lifestyle Modifications to prevent Acid Reflux.  Rec. Avoid coffee, sodas, peppermint, garlic, onions, alcohol, citrus fruits, chocolate, tomatoes, fatty and spicey foods.  Avoid eating 2-3 hours before bedtime.    2.  Small hiatal hernia  Reassurance.  Patient education discussed.  Surgery is not recommended.  3.  Mild  Intermittent Abdominal cramping / Colon Spasm  Rx dicyclomine 10 Mg 1 3 times daily as needed abdominal cramping, #90, 3 refills.  Follow-up if symptoms worsen.  4.  Colon cancer screening  Plan for 10-year repeat screening colonoscopy in 2033.  Patient notified.    Celso Amy, PA-C  Follow up as needed if she has recurrent or worsening GI symptoms.

## 2023-08-12 ENCOUNTER — Emergency Department

## 2023-08-12 ENCOUNTER — Emergency Department
Admission: EM | Admit: 2023-08-12 | Discharge: 2023-08-12 | Disposition: A | Discharge status: Home / Self Care | Attending: Emergency Medicine | Admitting: Emergency Medicine

## 2023-08-12 ENCOUNTER — Encounter: Payer: Self-pay | Admitting: *Deleted

## 2023-08-12 ENCOUNTER — Other Ambulatory Visit: Payer: Self-pay

## 2023-08-12 DIAGNOSIS — R519 Headache, unspecified: Secondary | ICD-10-CM | POA: Diagnosis present

## 2023-08-12 DIAGNOSIS — R140 Abdominal distension (gaseous): Secondary | ICD-10-CM | POA: Diagnosis not present

## 2023-08-12 LAB — CBC
HCT: 40.4 % (ref 36.0–46.0)
Hemoglobin: 13.5 g/dL (ref 12.0–15.0)
MCH: 28.7 pg (ref 26.0–34.0)
MCHC: 33.4 g/dL (ref 30.0–36.0)
MCV: 85.8 fL (ref 80.0–100.0)
Platelets: 364 10*3/uL (ref 150–400)
RBC: 4.71 MIL/uL (ref 3.87–5.11)
RDW: 12.8 % (ref 11.5–15.5)
WBC: 8.6 10*3/uL (ref 4.0–10.5)
nRBC: 0 % (ref 0.0–0.2)

## 2023-08-12 LAB — BASIC METABOLIC PANEL WITH GFR
Anion gap: 9 (ref 5–15)
BUN: 12 mg/dL (ref 6–20)
CO2: 26 mmol/L (ref 22–32)
Calcium: 9.8 mg/dL (ref 8.9–10.3)
Chloride: 102 mmol/L (ref 98–111)
Creatinine, Ser: 0.5 mg/dL (ref 0.44–1.00)
GFR, Estimated: >60 mL/min (ref >60)
Glucose, Bld: 98 mg/dL (ref 70–99)
Potassium: 3.9 mmol/L (ref 3.5–5.1)
Sodium: 137 mmol/L (ref 135–145)

## 2023-08-12 LAB — HEPATIC FUNCTION PANEL
ALT: 22 U/L (ref 0–44)
AST: 23 U/L (ref 15–41)
Albumin: 4.2 g/dL (ref 3.5–5.0)
Alkaline Phosphatase: 109 U/L (ref 38–126)
Bilirubin, Direct: <0.1 mg/dL (ref 0.0–0.2)
Total Bilirubin: 0.4 mg/dL (ref 0.0–1.2)
Total Protein: 7.5 g/dL (ref 6.5–8.1)

## 2023-08-12 LAB — LIPASE, BLOOD: Lipase: 31 U/L (ref 11–51)

## 2023-08-12 MED ORDER — METOCLOPRAMIDE HCL 5 MG/ML IJ SOLN
10.0000 mg | Freq: Once | INTRAMUSCULAR | Status: AC
  Administered 2023-08-12: 10 mg via INTRAVENOUS
  Filled 2023-08-12: qty 2

## 2023-08-12 MED ORDER — LACTATED RINGERS IV BOLUS
1000.0000 mL | Freq: Once | INTRAVENOUS | Status: AC
  Administered 2023-08-12: 1000 mL via INTRAVENOUS

## 2023-08-12 MED ORDER — DIPHENHYDRAMINE HCL 50 MG/ML IJ SOLN
25.0000 mg | Freq: Once | INTRAMUSCULAR | Status: AC
Start: 2023-08-12 — End: 2023-08-12
  Administered 2023-08-12: 25 mg via INTRAVENOUS
  Filled 2023-08-12: qty 1

## 2023-08-12 MED ORDER — DIPHENHYDRAMINE HCL 50 MG/ML IJ SOLN
25.0000 mg | Freq: Once | INTRAMUSCULAR | Status: AC
  Administered 2023-08-12: 25 mg via INTRAVENOUS
  Filled 2023-08-12: qty 1

## 2023-08-12 MED ORDER — IOHEXOL 300 MG/ML  SOLN
100.0000 mL | Freq: Once | INTRAMUSCULAR | Status: AC | PRN
  Administered 2023-08-12: 100 mL via INTRAVENOUS

## 2023-08-12 MED ORDER — POLYETHYLENE GLYCOL 3350 17 G PO PACK: 17.0000 g | PACK | Freq: Every day | ORAL | 0 refills | Status: AC

## 2023-08-12 NOTE — ED Provider Notes (Signed)
 [facility] Provider Note    Event Date/Time   First MD Initiated Contact with Patient 08/12/23 1859     (approximate)   History   Headache   HPI  Monica Obrien is a 53 y.o. female with history of GERD, migraines, presenting with headache since yesterday as well as abdominal bloating and nausea.  Patient states that she does have history of migraines, headache feels like her migraines, started on slow and progressed over time, it is on the left side, she denies any vision changes, no weakness or numbness, no vomiting, no recent trauma or falls.  She denies any chest pain or shortness of breath or cough, no urinary symptoms.  Also states that she has been belching a lot, has been having a lot of abdominal bloating, states that she has not had any bowel movements, is not having any flatus for last several days.  Per her daughter she is not currently followed by neurologist for her migraines.  Independent history obtained from daughter as above.  Encounter was done with Spanish interpreter.   Physical Exam   Triage Vital Signs: ED Triage Vitals  Encounter Vitals Group     BP 08/12/23 1612 132/89     Systolic BP Percentile --      Diastolic BP Percentile --      Pulse Rate 08/12/23 1612 88     Resp 08/12/23 1612 18     Temp 08/12/23 1612 97.9 F (36.6 C)     Temp Source 08/12/23 1612 Oral     SpO2 08/12/23 1612 97 %     Weight 08/12/23 1613 165 lb (74.8 kg)     Height 08/12/23 1613 5' (1.524 m)     Head Circumference --      Peak Flow --      Pain Score 08/12/23 1612 9     Pain Loc --      Pain Education --      Exclude from Growth Chart --     Most recent vital signs: Vitals:   08/12/23 1612 08/12/23 2015  BP: 132/89 128/80  Pulse: 88 85  Resp: 18 18  Temp: 97.9 F (36.6 C) 98 F (36.7 C)  SpO2: 97% 98%     General: Awake, no distress.  Nontoxic appearing CV:  Good peripheral perfusion.  Resp:  Normal effort.  Tachypnea or  respiratory distress Abd:  No distention.  Soft nontender Other:  Pupils are equal and reactive, extraocular movements are intact, no nuchal rigidity, no cranial nerve deficits, no focal weakness or numbness   ED Results / Procedures / Treatments   Labs (all labs ordered are listed, but only abnormal results are displayed) Labs Reviewed  BASIC METABOLIC PANEL WITH GFR  CBC  HEPATIC FUNCTION PANEL  LIPASE, BLOOD   RADIOLOGY On my independent interpretation, CT head without obvious intracranial hemorrhage   PROCEDURES:  Critical Care performed: No  Procedures   MEDICATIONS ORDERED IN ED: Medications  lactated ringers  bolus 1,000 mL (1,000 mLs Intravenous New Bag/Given 08/12/23 1940)  metoCLOPramide  (REGLAN ) injection 10 mg (10 mg Intravenous Given 08/12/23 1931)  diphenhydrAMINE  (BENADRYL ) injection 25 mg (25 mg Intravenous Given 08/12/23 1931)  diphenhydrAMINE  (BENADRYL ) injection 25 mg (25 mg Intravenous Given 08/12/23 1949)  iohexol  (OMNIPAQUE ) 300 MG/ML solution 100 mL (100 mLs Intravenous Contrast Given 08/12/23 2013)     IMPRESSION / MDM / ASSESSMENT AND PLAN / ED COURSE  I reviewed the triage vital signs and the  nursing notes.                              Differential diagnosis includes, but is not limited to, migraines, tension headache, considered but doubt intracranial hemorrhage or subarachnoid, not thunderclap headache, no focal weakness or numbness, no nuchal rigidity or fever or altered mental status suggest meningitis, for her abdominal bloating and not passing any flatus, considered bowel obstruction, GERD, constipation.  Will get labs, CT abdomen pelvis, will give her migraine cocktail, IV fluids.  Reassess.  Discussed with patient and daughter about giving them a number to call to follow-up with neurology outpatient.  Shared decision making done with patient and they are agreeable with the plan.  Patient's presentation is most consistent with acute presentation with  potential threat to life or bodily function.  Independent review of labs and imaging below.  On reassessment patient is feeling significantly better.  Shared decision making with patient and daughter and they are agreeable plan for discharge and outpatient follow-up, recommended ibuprofen or Tylenol  as needed for the headache, also gave her a number to call for neurology for outpatient follow-up.  For her constipation and bloating, prescribed her MiraLAX that she can take daily until she is stooling regularly.  Instructed her to follow-up with her primary care doctor for further management of her bloating and constipation.  Otherwise considered but no indication for inpatient admission at this time, she safe for outpatient management.  Will discharge with strict return precautions.   Clinical Course as of 08/12/23 2059  Mon Aug 12, 2023  2052 CT Head Wo Contrast Normal study.  [TT]  2053 CT ABDOMEN PELVIS W CONTRAST No acute findings in the abdomen or pelvis.  [TT]  2059 Independent review of labs, LFTs are normal, lipase is normal, electrolytes are severely deranged, creatinine is normal, no leukocytosis. [TT]    Clinical Course User Index [TT] Drenda Gentle Richard Champion, MD     FINAL CLINICAL IMPRESSION(S) / ED DIAGNOSES   Final diagnoses:  Acute nonintractable headache, unspecified headache type  Abdominal bloating     Rx / DC Orders   ED Discharge Orders          Ordered    polyethylene glycol (MIRALAX) 17 g packet  Daily        08/12/23 2053             Note:  This document was prepared using Dragon voice recognition software and may include unintentional dictation errors.    Shane Darling, MD 08/12/23 [phone]

## 2023-08-12 NOTE — ED Triage Notes (Signed)
 Pt to triage via wheelchair.  Pt sent from [facility] for eval of headache for 3 days.  Pt taking otc meds without relief.  Pt states headache on left side of head.  Hx migraines.  Pt has nausea.  Pt alert  speech clear.

## 2023-08-12 NOTE — ED Triage Notes (Signed)
 Referred to ED from [facility]. C/O left sided headache x 3 days, intermittent nausea and burning abd pain.  VS wnl

## 2023-08-12 NOTE — ED Notes (Signed)
 See triage note  Presents with headache  States pain has eased off some rates pain about 3-4 Denies any n/v  fever  Min relief with OTC meds

## 2023-08-12 NOTE — ED Notes (Signed)
 No ct scan at this time per dr Scotty Court.

## 2023-08-12 NOTE — Discharge Instructions (Addendum)
 You can take 600 mg of ibuprofen or 650 mg of Tylenol  every 6 hours as needed for headache.  For your constipation I have prescribed you MiraLAX, please take 1 packet a day until you are having bowel movements daily.

## 2023-09-13 NOTE — Progress Notes (Unsigned)
 [facility] [address] [place], [place] [address]  Pulmonary Sleep Medicine   Office Visit Note  Patient Name: Monica Obrien DOB: Aug 02, 1970 MRN [id number]    Chief Complaint: Obstructive Sleep Apnea visit  Brief History:  Monica Obrien is seen today for an initial consult for CPAP@ 12 cmH2O and to establish care. The patient has a 7 month history of sleep apnea. Patient is not using PAP nightly.  The patient feels not rested after sleeping with PAP.  The patient reports benefiting from PAP use. Reported sleepiness is not improved and the Epworth Sleepiness Score is 0 out of 24. The patient does not take naps. The patient complains of the following: pt is complaining that pressure is too strong and is requesting to change pressure.  The compliance download shows 1% compliance with an average use time of 1 hour 30 minutes. The AHI is 0.8.  The patient does complain of limb movements disrupting sleep. The patient continues to require PAP therapy in order to eliminate sleep apnea.  The patient reports her mask making a noise which alarmed her.   ROS  General: (-) fever, (-) chills, (-) night sweat Nose and Sinuses: (-) nasal stuffiness or itchiness, (-) postnasal drip, (-) nosebleeds, (-) sinus trouble. Mouth and Throat: (-) sore throat, (-) hoarseness. Neck: (-) swollen glands, (-) enlarged thyroid, (-) neck pain. Respiratory: - cough, - shortness of breath, - wheezing. Neurologic: - numbness, - tingling. Psychiatric: - anxiety, - depression   Current Medication: Outpatient Encounter Medications as of 09/16/2023  Medication Sig   dicyclomine  (BENTYL ) 10 MG capsule Take 1 capsule (10 mg total) by mouth 3 (three) times daily before meals.   omeprazole  (PRILOSEC) 20 MG capsule Take 1 capsule (20 mg total) by mouth 2 (two) times daily before a meal.   polyethylene glycol (MIRALAX ) 17 g packet Take 17 g by mouth daily.   No facility-administered encounter medications on  file as of 09/16/2023.    Surgical History: Past Surgical History:  Procedure Laterality Date   BILATERAL SALPINGECTOMY Bilateral 08/26/2015   Procedure: BILATERAL SALPINGECTOMY;  Surgeon: Cassandra Cleveland, MD;  Location: [facility] ORS;  Service: Gynecology;  Laterality: Bilateral;   BIOPSY  01/31/2023   Procedure: BIOPSY;  Surgeon: Selena Daily, MD;  Location: [facility] END[facility]COPY;  Service: Gastroenterology;;   COLONOSCOPY WITH PROPOFOL  N/A 03/26/2022   Procedure: COLONOSCOPY WITH PROPOFOL ;  Surgeon: Selena Daily, MD;  Location: [facility] ENDOSCOPY;  Service: Gastroenterology;  Laterality: N/A;  SPANISH INTERPRETER   CYSTOSCOPY  08/26/2015   Procedure: CYSTOSCOPY;  Surgeon: Cassandra Cleveland, MD;  Location: [facility] ORS;  Service: Gynecology;;   ESOPHAGOGASTRODUODENOSCOPY (EGD) WITH PROPOFOL  N/A 01/31/2023   Procedure: ESOPHAGOGASTRODUODENOSCOPY (EGD) WITH PROPOFOL ;  Surgeon: Selena Daily, MD;  Location[facility] ENDOSCOPY;  Service: Gastroenterology;  Laterality: N/A;  SPANISH INTERPRETER   LAPAROSCOPIC HYSTERECTOMY N/A 08/26/2015   Procedure: HYSTERECTOMY TOTAL LAPAROSCOPIC;  Surgeon: Cassandra Cleveland, MD;  Location: [facility] ORS;  Service: Gynecology;  Laterality: N/A;   TUBAL LIGATION      Medical History: Past Medical History:  Diagnosis Date   Anemia    Complication of anesthesia    elevated blood pressure   Difficult intubation    GERD (gastroesophageal reflux disease)    Migraines     Family History: Non contributory to the present illness  Social History: Social History   Socioeconomic History   Marital status: Divorced    Spouse name: Not on file   Number of children: Not  on file   Years of education: Not on file   Highest education level: Not on file  Occupational History   Not on file  Tobacco Use   Smoking status: Never   Smokeless tobacco: Never  Vaping Use   Vaping status: Never Used  Substance and Sexual Activity   Alcohol use: No   Drug use: No   Sexual  activity: Not on file  Other Topics Concern   Not on file  Social History Narrative   Not on file   Social Drivers of Health   Financial Resource Strain: Not on file  Food Insecurity: Not on file  Transportation Needs: Not on file  Physical Activity: Not on file  Stress: Not on file  Social Connections: Not on file  Intimate Partner Violence: Not on file    Vital Signs: Blood pressure 132/80, pulse 80, resp. rate 16, height 5' (1.524 m), weight 162 lb (73.5 kg), last menstrual period 06/08/2015, SpO2 98%. Body mass index is 31.64 kg/m.    Examination: General Appearance: The patient is well-developed, well-nourished, and in no distress. Neck Circumference: 37 cm Skin: Gross inspection of skin unremarkable. Head: normocephalic, no gross deformities. Eyes: no gross deformities noted. ENT: ears appear grossly normal Neurologic: Alert and oriented. No involuntary movements.  STOP BANG RISK ASSESSMENT S (snore) Have you been told that you snore?     YES   T (tired) Are you often tired, fatigued, or sleepy during the day?   YES  O (obstruction) Do you stop breathing, choke, or gasp during sleep? NO   P (pressure) Do you have or are you being treated for high blood pressure? NO   B (BMI) Is your body index greater than 35 kg/m? YES   A (age) Are you 4 years old or older? YES   N (neck) Do you have a neck circumference greater than 16 inches?   NO   G (gender) Are you a female? NO   TOTAL STOP/BANG "YES" ANSWERS 4       A STOP-Bang score of 2 or less is considered low risk, and a score of 5 or more is high risk for having either moderate or severe OSA. For people who score 3 or 4, doctors may need to perform further assessment to determine how likely they are to have OSA.         EPWORTH SLEEPINESS SCALE:  Scale:  (0)= no chance of dozing; (1)= slight chance of dozing; (2)= moderate chance of dozing; (3)= high chance of dozing  Chance  Situtation    Sitting and  reading: 0    Watching TV: 0    Sitting Inactive in public: 0    As a passenger in car: 0      Lying down to rest: 0    Sitting and talking: 0    Sitting quielty after lunch: 0    In a car, stopped in traffic: 0   TOTAL SCORE:   0 out of 24    SLEEP STUDIES:  PSG (02/2023) AHI 12.2/hr, REM AHI 36/hr, min SpO2 85% Titration (03/2023) CPAP@ 12 cmH2O   CPAP COMPLIANCE DATA:  Date Range: 06/20/2023-09/12/2023  Average Daily Use: 1 hour 30 minutes  Median Use: 1 hour 19 minutes  Compliance for > 4 Hours:  1%  AHI: 0.8 respiratory events per hour  Days Used: 12/85 days  Mask Leak: 19.5  95th Percentile Pressure: 12         LABS: Recent Results (  from the past 2160 hours)  Basic metabolic panel     Status: None   Collection Time: 08/12/23  4:14 PM  Result Value Ref Range   Sodium 137 135 - 145 mmol/L   Potassium 3.9 3.5 - 5.1 mmol/L   Chloride 102 98 - 111 mmol/L   CO2 26 22 - 32 mmol/L   Glucose, Bld 98 70 - 99 mg/dL    Comment: Glucose reference range applies only to samples taken after fasting for at least 8 hours.   BUN 12 6 - 20 mg/dL   Creatinine, Ser 9.60 0.44 - 1.00 mg/dL   Calcium 9.8 8.9 - 45.4 mg/dL   GFR, Estimated >09 >81 mL/min    Comment: (NOTE) Calculated using the CKD-EPI Creatinine Equation (2021)    Anion gap 9 5 - 15    Comment: Performed at [facility], [address] [place], [place] [address]  CBC     Status: None   Collection Time: 08/12/23  4:14 PM  Result Value Ref Range   WBC 8.6 4.0 - 10.5 K/uL   RBC 4.71 3.87 - 5.11 MIL/uL   Hemoglobin 13.5 12.0 - 15.0 g/dL   HCT 82.9 56.2 - 13.0 %   MCV 85.8 80.0 - 100.0 fL   MCH 28.7 26.0 - 34.0 pg   MCHC 33.4 30.0 - 36.0 g/dL   RDW 86.5 78.4 - 69.6 %   Platelets 364 150 - 400 K/uL   nRBC 0.0 0.0 - 0.2 %    Comment: Performed at [facility], [address], [place], [place] [address]  Hepatic function panel     Status: None   Collection Time: 08/12/23   4:14 PM  Result Value Ref Range   Total Protein 7.5 6.5 - 8.1 g/dL   Albumin 4.2 3.5 - 5.0 g/dL   AST 23 15 - 41 U/L   ALT 22 0 - 44 U/L   Alkaline Phosphatase 109 38 - 126 U/L   Total Bilirubin 0.4 0.0 - 1.2 mg/dL   Bilirubin, Direct <4.1 0.0 - 0.2 mg/dL   Indirect Bilirubin NOT CALCULATED 0.3 - 0.9 mg/dL    Comment: Performed at [facility], [address] [place], [place] [address]  Lipase, blood     Status: None   Collection Time: 08/12/23  4:14 PM  Result Value Ref Range   Lipase 31 11 - 51 U/L    Comment: Performed at [facility], [address], [place], [place] [address]    Radiology: CT Head Wo Contrast Result Date: 08/12/2023 CLINICAL DATA:  Headache EXAM: CT HEAD WITHOUT CONTRAST TECHNIQUE: Contiguous axial images were obtained from the base of the skull through the vertex without intravenous contrast. RADIATION DOSE REDUCTION: This exam was performed according to the departmental dose-optimization program which includes automated exposure control, adjustment of the mA and/or kV according to patient size and/or use of iterative reconstruction technique. COMPARISON:  10/16/2022 FINDINGS: Brain: No acute intracranial abnormality. Specifically, no hemorrhage, hydrocephalus, mass lesion, acute infarction, or significant intracranial injury. Vascular: No hyperdense vessel or unexpected calcification. Skull: No acute calvarial abnormality. Sinuses/Orbits: No acute findings Other: None IMPRESSION: Normal study. Electronically Signed   By: Janeece Mechanic M.D.   On: 08/12/2023 20:49   CT ABDOMEN PELVIS W CONTRAST Result Date: 08/12/2023 CLINICAL DATA:  Nausea, abdominal pain EXAM: CT ABDOMEN AND PELVIS WITH CONTRAST TECHNIQUE: Multidetector CT imaging of the abdomen and pelvis was performed using the standard protocol following bolus administration of  intravenous contrast. RADIATION DOSE REDUCTION: This exam was performed according to the departmental dose-optimization  program which includes automated exposure control, adjustment of the mA and/or kV according to patient size and/or use of iterative reconstruction technique. CONTRAST:  OMNIPAQUE  IOHEXOL  300 MG/ML  SOLN COMPARISON:  04/18/2018 FINDINGS: Lower chest: No acute abnormality Hepatobiliary: No suspicious focal hepatic abnormality. Stable subcentimeter hypodensity in the left hepatic lobe, likely cyst. Gallbladder unremarkable. Pancreas: No focal abnormality or ductal dilatation. Spleen: No focal abnormality.  Normal size. Adrenals/Urinary Tract: No adrenal abnormality. No focal renal abnormality. No stones or hydronephrosis. Urinary bladder is unremarkable. Stomach/Bowel: Normal appendix. Stomach, large and small bowel grossly unremarkable. No bowel obstruction or inflammatory process. Vascular/Lymphatic: No evidence of aneurysm or adenopathy. Reproductive: Prior hysterectomy.  No adnexal masses. Other: No free fluid or free air. Musculoskeletal: No acute bony abnormality. IMPRESSION: No acute findings in the abdomen or pelvis. Electronically Signed   By: Janeece Mechanic M.D.   On: 08/12/2023 20:49    No results found.  No results found.    Assessment and Plan: Patient Active Problem List   Diagnosis Date Noted   OSA (obstructive sleep apnea) 09/16/2023   CPAP use counseling 09/16/2023   Obesity (BMI 30-39.9) 09/16/2023   Erosive esophagitis 01/31/2023   Abdominal bloating 01/31/2023   Screening for colon cancer 03/26/2022   Urinary urgency 06/09/2018   Thrombocytopenia (HCC) 04/18/2018   Migraine without status migrainosus, not intractable 08/01/2016   Numbness and tingling in left hand 08/01/2016   Migraine without aura and without status migrainosus, not intractable 09/20/2015   Muscle pain 09/20/2015   S/P laparoscopic hysterectomy 08/26/2015   Headache disorder 03/22/2015   1. OSA (obstructive sleep apnea) (Primary) The patient does not  tolerate PAP and reports no benefit from PAP use.  She is very anxious. I have advised a mask fitting appointment. I have recommended acclimation strategies. We will lower pressure 8 cm and do a 3 week download. The patient was reminded how to clean equipment and advised to replace supplies routinely. The patient was also counselled on weight loss. The compliance is poor. The AHI is 0.8.   OSA on cpap- controlled. Continue with excellent compliance with pap. CPAP continues to be medically necessary to treat this patient's OSA. F/u 57m  2. CPAP use counseling CPAP Counseling: had a lengthy discussion with the patient regarding the importance of PAP therapy in management of the sleep apnea. Patient appears to understand the risk factor reduction and also understands the risks associated with untreated sleep apnea. Patient will try to make a good faith effort to remain compliant with therapy. Also instructed the patient on proper cleaning of the device including the water  must be changed daily if possible and use of distilled water  is preferred. Patient understands that the machine should be regularly cleaned with appropriate recommended cleaning solutions that do not damage the PAP machine for example given white vinegar and water  rinses. Other methods such as ozone treatment may not be as good as these simple methods to achieve cleaning.   3. Obesity (BMI 30-39.9) Obesity Counseling: Had a lengthy discussion regarding patients BMI and weight issues. Patient was instructed on portion control as well as increased activity. Also discussed caloric restrictions with trying to maintain intake less than 2000 Kcal. Discussions were made in accordance with the 5As of weight management. Simple actions such as not eating late and if able to, taking a walk is suggested.     General Counseling: I have  discussed the findings of the evaluation and examination with Monica Obrien.  I have also discussed any further diagnostic evaluation thatmay be needed or ordered today. Monica Obrien  verbalizes understanding of the findings of todays visit. We also reviewed her medications today and discussed drug interactions and side effects including but not limited excessive drowsiness and altered mental states. We also discussed that there is always a risk not just to her but also people around her. she has been encouraged to call the office with any questions or concerns that should arise related to todays visit.  No orders of the defined types were placed in this encounter.       I have personally obtained a history, examined the patient, evaluated laboratory and imaging results, formulated the assessment and plan and placed orders. This patient was seen today by Louann Rous, PA-C in collaboration with Dr. Cam Cava.   Cordie Deters, MD [facility] Diplomate ABMS Pulmonary Critical Care Medicine and Sleep Medicine

## 2023-09-16 ENCOUNTER — Ambulatory Visit (INDEPENDENT_AMBULATORY_CARE_PROVIDER_SITE_OTHER): Payer: Self-pay | Admitting: Internal Medicine

## 2023-09-16 VITALS — BP 132/80 | HR 80 | Resp 16 | Ht 60.0 in | Wt 162.0 lb

## 2023-09-16 DIAGNOSIS — G4733 Obstructive sleep apnea (adult) (pediatric): Secondary | ICD-10-CM | POA: Diagnosis not present

## 2023-09-16 DIAGNOSIS — Z7189 Other specified counseling: Secondary | ICD-10-CM | POA: Diagnosis not present

## 2023-09-16 DIAGNOSIS — E669 Obesity, unspecified: Secondary | ICD-10-CM

## 2023-09-16 NOTE — Patient Instructions (Signed)

## 2023-11-22 NOTE — Progress Notes (Signed)
 [facility] [address] [place], [place] [address]  Pulmonary Sleep Medicine   Office Visit Note  Patient Name: Monica Obrien DOB: 08-Jan-1971 MRN [id number]    Chief Complaint: Obstructive Sleep Apnea visit  Brief History:  Monica Obrien is seen today for a follow up visit for CPAP@ 8 cmH2O. The patient has a 9 month history of sleep apnea. Patient is not using PAP nightly.  The patient feels not rested after sleeping with PAP.  The patient reports benefiting from PAP use. Reported sleepiness is  improved and the Epworth Sleepiness Score is 0 out of 24. The patient does not take naps. The patient complains of the following: pt is struggling to feel rested with her mask. Patient is not comfortable with headgear despite mask different mask fittings. Pt is still trying to acclimate to therapy.  The compliance download shows 13% compliance with an average use time of 3 hours 7 minutes. The AHI is 1.8.  The patient does not complain of limb movements disrupting sleep. The patient continues to require PAP therapy in order to eliminate sleep apnea.   ROS  General: (-) fever, (-) chills, (-) night sweat Nose and Sinuses: (-) nasal stuffiness or itchiness, (-) postnasal drip, (-) nosebleeds, (-) sinus trouble. Mouth and Throat: (-) sore throat, (-) hoarseness. Neck: (-) swollen glands, (-) enlarged thyroid, (-) neck pain. Respiratory: - cough, - shortness of breath, - wheezing. Neurologic: - numbness, - tingling. Psychiatric: - anxiety, - depression   Current Medication: Outpatient Encounter Medications as of 11/25/2023  Medication Sig   dicyclomine  (BENTYL ) 10 MG capsule Take 1 capsule (10 mg total) by mouth 3 (three) times daily before meals.   NURTEC 75 MG TBDP Take 1 tablet by mouth daily as needed.   omeprazole  (PRILOSEC) 20 MG capsule Take 1 capsule (20 mg total) by mouth 2 (two) times daily before a meal.   polyethylene glycol (MIRALAX ) 17 g packet Take 17 g by  mouth daily.   No facility-administered encounter medications on file as of 11/25/2023.    Surgical History: Past Surgical History:  Procedure Laterality Date   BILATERAL SALPINGECTOMY Bilateral 08/26/2015   Procedure: BILATERAL SALPINGECTOMY;  Surgeon: Orvil MARLA Arenas, MD;  Location: [facility] ORS;  Service: Gynecology;  Laterality: Bilateral;   BIOPSY  01/31/2023   Procedure: BIOPSY;  Surgeon: Unk Corinn Skiff, MD;  Location: [facility] ENDOSCOPY;  Service: Gastroenterology;;   COLONOSCOPY WITH PROPOFOL  N/A 03/26/2022   Procedure: COLONOSCOPY WITH PROPOFOL ;  Surgeon: Unk Corinn Skiff, MD;  Location: [facility] ENDOSCOPY;  Service: Gastroenterology;  Laterality: N/A;  SPANISH INTERPRETER   CYSTOSCOPY  08/26/2015   Procedure: CYSTOSCOPY;  Surgeon: Orvil MARLA Arenas, MD;  Location: [facility] ORS;  Service: Gynecology;;   ESOPHAGOGASTRODUODENOSCOPY (EGD) WITH PROPOFOL  N/A 01/31/2023   Procedure: ESOPHAGOGASTRODUODENOSCOPY (EGD) WITH PROPOFOL ;  Surgeon: Unk Corinn Skiff, MD;  Location: [facility] ENDOSCOPY;  Service: Gastroenterology;  Laterality: N/A;  SPANISH INTERPRETER   LAPAROSCOPIC HYSTERECTOMY N/A 08/26/2015   Procedure: HYSTERECTOMY TOTAL LAPAROSCOPIC;  Surgeon: Orvil MARLA Arenas, MD;  Location: [facility] ORS;  Service: Gynecology;  Laterality: N/A;   TUBAL LIGATION      Medical History: Past Medical History:  Diagnosis Date   Anemia    Complication of anesthesia    elevated blood pressure   Difficult intubation    GERD (gastroesophageal reflux disease)    Migraines     Family History: Non contributory to the present illness  Social History: Social History   Socioeconomic History   Marital status: Divorced  Spouse name: Not on file   Number of children: Not on file   Years of education: Not on file   Highest education level: Not on file  Occupational History   Not on file  Tobacco Use   Smoking status: Never   Smokeless tobacco: Never  Vaping Use   Vaping status: Never Used   Substance and Sexual Activity   Alcohol use: No   Drug use: No   Sexual activity: Not on file  Other Topics Concern   Not on file  Social History Narrative   Not on file   Social Drivers of Health   Financial Resource Strain: Not on file  Food Insecurity: Not on file  Transportation Needs: Not on file  Physical Activity: Not on file  Stress: Not on file  Social Connections: Not on file  Intimate Partner Violence: Not on file    Vital Signs: Blood pressure 110/79, pulse 74, resp. rate 16, height 5' (1.524 m), weight 160 lb (72.6 kg), last menstrual period 06/08/2015, SpO2 97%. Body mass index is 31.25 kg/m.    Examination: General Appearance: The patient is well-developed, well-nourished, and in no distress. Neck Circumference: 37 cm Skin: Gross inspection of skin unremarkable. Head: normocephalic, no gross deformities. Eyes: no gross deformities noted. ENT: ears appear grossly normal Neurologic: Alert and oriented. No involuntary movements.  STOP BANG RISK ASSESSMENT S (snore) Have you been told that you snore?     YES   T (tired) Are you often tired, fatigued, or sleepy during the day?   NO  O (obstruction) Do you stop breathing, choke, or gasp during sleep? NO   P (pressure) Do you have or are you being treated for high blood pressure? NO   B (BMI) Is your body index greater than 35 kg/m? NO   A (age) Are you 3 years old or older? YES   N (neck) Do you have a neck circumference greater than 16 inches?   NO   G (gender) Are you a female? NO   TOTAL STOP/BANG "YES" ANSWERS 2       A STOP-Bang score of 2 or less is considered low risk, and a score of 5 or more is high risk for having either moderate or severe OSA. For people who score 3 or 4, doctors may need to perform further assessment to determine how likely they are to have OSA.         EPWORTH SLEEPINESS SCALE:  Scale:  (0)= no chance of dozing; (1)= slight chance of dozing; (2)= moderate chance of  dozing; (3)= high chance of dozing  Chance  Situtation    Sitting and reading: 0    Watching TV: 0    Sitting Inactive in public: 0    As a passenger in car: 0      Lying down to rest: 0    Sitting and talking: 0    Sitting quielty after lunch: 0    In a car, stopped in traffic: 0   TOTAL SCORE:   0 out of 24    SLEEP STUDIES:  PSG (02/2023) AHI 12.2/hr, REM AHI 36/hr, min SpO2 85% Titration (03/2023) CPAP@ 12 cmH2O   CPAP COMPLIANCE DATA:  Date Range: 09/22/2023-11/20/2023  Average Daily Use: 3 hours 7 minutes  Median Use: 2 hours 57 minutes  Compliance for > 4 Hours: 13%  AHI: 1.8 respiratory events per hour  Days Used: 31/60 days  Mask Leak: 9.1  95th Percentile Pressure: 8  LABS: No results found for this or any previous visit (from the past 2160 hours).  Radiology: CT Head Wo Contrast Result Date: 08/12/2023 CLINICAL DATA:  Headache EXAM: CT HEAD WITHOUT CONTRAST TECHNIQUE: Contiguous axial images were obtained from the base of the skull through the vertex without intravenous contrast. RADIATION DOSE REDUCTION: This exam was performed according to the departmental dose-optimization program which includes automated exposure control, adjustment of the mA and/or kV according to patient size and/or use of iterative reconstruction technique. COMPARISON:  10/16/2022 FINDINGS: Brain: No acute intracranial abnormality. Specifically, no hemorrhage, hydrocephalus, mass lesion, acute infarction, or significant intracranial injury. Vascular: No hyperdense vessel or unexpected calcification. Skull: No acute calvarial abnormality. Sinuses/Orbits: No acute findings Other: None IMPRESSION: Normal study. Electronically Signed   By: Franky Crease M.D.   On: 08/12/2023 20:49   CT ABDOMEN PELVIS W CONTRAST Result Date: 08/12/2023 CLINICAL DATA:  Nausea, abdominal pain EXAM: CT ABDOMEN AND PELVIS WITH CONTRAST TECHNIQUE: Multidetector CT imaging of the abdomen and  pelvis was performed using the standard protocol following bolus administration of intravenous contrast. RADIATION DOSE REDUCTION: This exam was performed according to the departmental dose-optimization program which includes automated exposure control, adjustment of the mA and/or kV according to patient size and/or use of iterative reconstruction technique. CONTRAST:  OMNIPAQUE  IOHEXOL  300 MG/ML  SOLN COMPARISON:  04/18/2018 FINDINGS: Lower chest: No acute abnormality Hepatobiliary: No suspicious focal hepatic abnormality. Stable subcentimeter hypodensity in the left hepatic lobe, likely cyst. Gallbladder unremarkable. Pancreas: No focal abnormality or ductal dilatation. Spleen: No focal abnormality.  Normal size. Adrenals/Urinary Tract: No adrenal abnormality. No focal renal abnormality. No stones or hydronephrosis. Urinary bladder is unremarkable. Stomach/Bowel: Normal appendix. Stomach, large and small bowel grossly unremarkable. No bowel obstruction or inflammatory process. Vascular/Lymphatic: No evidence of aneurysm or adenopathy. Reproductive: Prior hysterectomy.  No adnexal masses. Other: No free fluid or free air. Musculoskeletal: No acute bony abnormality. IMPRESSION: No acute findings in the abdomen or pelvis. Electronically Signed   By: Franky Crease M.D.   On: 08/12/2023 20:49    No results found.  No results found.    Assessment and Plan: Patient Active Problem List   Diagnosis Date Noted   OSA (obstructive sleep apnea) 09/16/2023   CPAP use counseling 09/16/2023   Obesity (BMI 30-39.9) 09/16/2023   Erosive esophagitis 01/31/2023   Abdominal bloating 01/31/2023   Screening for colon cancer 03/26/2022   Urinary urgency 06/09/2018   Thrombocytopenia (HCC) 04/18/2018   Migraine without status migrainosus, not intractable 08/01/2016   Numbness and tingling in left hand 08/01/2016   Migraine without aura and without status migrainosus, not intractable 09/20/2015   Muscle pain  09/20/2015   S/P laparoscopic hysterectomy 08/26/2015   Headache disorder 03/22/2015    1. OSA (obstructive sleep apnea) (Primary) The patient is struggling to tolerate PAP and reports somke  benefit from PAP use. She feels she has increased her usage and tolerance of the machine since her last visit. She wants to continue to try to increase her usage. She was coached on methods of improving her acclimation.  The patient was reminded how to clean equipment and advised to replace supplies routinely. The patient was also counselled on weight loss. The compliance is poor. The AHI is 1.8.   OSA on cpap- controlled. Increase compliance with pap. CPAP continues to be medically necessary to treat this patient's OSA. F/u 20m   2. CPAP use counseling CPAP Counseling: had a lengthy discussion with the patient regarding the  importance of PAP therapy in management of the sleep apnea. Patient appears to understand the risk factor reduction and also understands the risks associated with untreated sleep apnea. Patient will try to make a good faith effort to remain compliant with therapy. Also instructed the patient on proper cleaning of the device including the water  must be changed daily if possible and use of distilled water  is preferred. Patient understands that the machine should be regularly cleaned with appropriate recommended cleaning solutions that do not damage the PAP machine for example given white vinegar and water  rinses. Other methods such as ozone treatment may not be as good as these simple methods to achieve cleaning.   3. Obesity (BMI 30-39.9) Obesity Counseling: Had a lengthy discussion regarding patients BMI and weight issues. Patient was instructed on portion control as well as increased activity. Also discussed caloric restrictions with trying to maintain intake less than 2000 Kcal. Discussions were made in accordance with the 5As of weight management. Simple actions such as not eating late and  if able to, taking a walk is suggested.    General Counseling: I have discussed the findings of the evaluation and examination with Lynder.  I have also discussed any further diagnostic evaluation thatmay be needed or ordered today. Deshon verbalizes understanding of the findings of todays visit. We also reviewed her medications today and discussed drug interactions and side effects including but not limited excessive drowsiness and altered mental states. We also discussed that there is always a risk not just to her but also people around her. she has been encouraged to call the office with any questions or concerns that should arise related to todays visit.  No orders of the defined types were placed in this encounter.       I have personally obtained a history, examined the patient, evaluated laboratory and imaging results, formulated the assessment and plan and placed orders. This patient was seen today by Lauraine Lay, PA-C in collaboration with Dr. Elfreda Bathe.   Elfreda DELENA Bathe, MD [facility] Diplomate ABMS Pulmonary Critical Care Medicine and Sleep Medicine

## 2023-11-25 ENCOUNTER — Ambulatory Visit (INDEPENDENT_AMBULATORY_CARE_PROVIDER_SITE_OTHER): Admitting: Internal Medicine

## 2023-11-25 VITALS — BP 110/79 | HR 74 | Resp 16 | Ht 60.0 in | Wt 160.0 lb

## 2023-11-25 DIAGNOSIS — Z7189 Other specified counseling: Secondary | ICD-10-CM | POA: Diagnosis not present

## 2023-11-25 DIAGNOSIS — E669 Obesity, unspecified: Secondary | ICD-10-CM

## 2023-11-25 DIAGNOSIS — G4733 Obstructive sleep apnea (adult) (pediatric): Secondary | ICD-10-CM | POA: Diagnosis not present

## 2023-11-25 NOTE — Patient Instructions (Signed)
